# Patient Record
Sex: Female | Born: 1990 | Race: Black or African American | Hispanic: No | Marital: Single | Smoking: Never smoker
Health system: Southern US, Community
[De-identification: ages and names within clinical notes are randomized; demographics above are authoritative.]

## PROBLEM LIST (undated history)

## (undated) ENCOUNTER — Inpatient Hospital Stay (HOSPITAL_COMMUNITY): Payer: Self-pay

## (undated) DIAGNOSIS — F32A Depression, unspecified: Secondary | ICD-10-CM

## (undated) DIAGNOSIS — F329 Major depressive disorder, single episode, unspecified: Secondary | ICD-10-CM

## (undated) HISTORY — PX: NO PAST SURGERIES: SHX2092

---

## 2015-02-15 ENCOUNTER — Inpatient Hospital Stay (HOSPITAL_COMMUNITY)
Admission: AD | Admit: 2015-02-15 | Discharge: 2015-02-15 | Disposition: A | Discharge status: Home / Self Care | Payer: Medicaid Other | Source: Ambulatory Visit | Attending: Obstetrics and Gynecology | Admitting: Obstetrics and Gynecology

## 2015-02-15 ENCOUNTER — Encounter (HOSPITAL_COMMUNITY): Payer: Self-pay | Admitting: *Deleted

## 2015-02-15 DIAGNOSIS — Z3201 Encounter for pregnancy test, result positive: Secondary | ICD-10-CM | POA: Insufficient documentation

## 2015-02-15 DIAGNOSIS — Z3200 Encounter for pregnancy test, result unknown: Secondary | ICD-10-CM | POA: Diagnosis present

## 2015-02-15 DIAGNOSIS — O09891 Supervision of other high risk pregnancies, first trimester: Secondary | ICD-10-CM

## 2015-02-15 DIAGNOSIS — Z3490 Encounter for supervision of normal pregnancy, unspecified, unspecified trimester: Secondary | ICD-10-CM

## 2015-02-15 DIAGNOSIS — Z862 Personal history of diseases of the blood and blood-forming organs and certain disorders involving the immune mechanism: Secondary | ICD-10-CM

## 2015-02-15 LAB — POCT PREGNANCY, URINE: Preg Test, Ur: POSITIVE — AB

## 2015-02-15 MED ORDER — FERROUS SULFATE 325 (65 FE) MG PO TABS: 325.0000 mg | ORAL_TABLET | Freq: Two times a day (BID) | ORAL | Status: DC

## 2015-02-15 MED ORDER — PRENATAL PLUS 27-1 MG PO TABS: 1.0000 | ORAL_TABLET | Freq: Every day | ORAL | Status: DC

## 2015-02-15 NOTE — MAU Provider Note (Signed)
CC: Possible Pregnancy       Subjective HPI Caitlin James 23 y.o. G1P0 at [redacted]w[redacted]d by sure LMP 12/03/14 presents for verification of pregnancy. HPT positive. Denies vaginal bleeding, abdominal pain and nausea/vomiting. No other concerns.   Significant PMH: None.  Significant OB history: None Nonsmoker  Objective Blood pressure 121/86, pulse 78, temperature 97.6 F (36.4 C), temperature source Oral, resp. rate 18, height  (1.676 m), weight 69.4 kg (153 lb), last menstrual period 12/03/2014, unknown if currently breastfeeding.  Physical Exam General: WN, WD female in no apparent distress Abdomen: soft, nontender, without organomegaly. No DT FHR heard per RN  Lab Results Results for orders placed or performed during the hospital encounter of 02/15/15 (from the past 24 hour(s))  Pregnancy, urine POC     Status: Abnormal   Collection Time: 02/15/15  1:47 PM  Result Value Ref Range   Preg Test, Ur POSITIVE (A) NEGATIVE    Assessment 23 y.o. G1P0 at [redacted]w[redacted]d 1. Pregnancy   2. H/O iron deficiency anemia   3. Short interval between pregnancies affecting pregnancy in first trimester, antepartum     Plan AVS on pregnancy precautions Pregnancy verification letter and eligibility information given List of prenatal care providers given   Medication List    TAKE these medications        ferrous sulfate 325 (65 FE) MG tablet  Commonly known as:  FERROUSUL  Take 1 tablet (325 mg total) by mouth 2 (two) times daily.     prenatal vitamin w/FE, FA 27-1 MG Tabs tablet  Take 1 tablet by mouth daily.         Danae Orleans, CNM 02/15/2015 1:53 PM

## 2015-02-15 NOTE — MAU Note (Signed)
Needs proof of preg, that is about it.  +HPT June

## 2015-02-15 NOTE — MAU Note (Signed)
Urine in lab 

## 2015-02-15 NOTE — Discharge Instructions (Signed)
First Trimester of Pregnancy °The first trimester of pregnancy is from week 1 until the end of week 12 (months 1 through 3). A week after a sperm fertilizes an egg, the egg will implant on the wall of the uterus. This embryo will begin to develop into a baby. Genes from you and your partner are forming the baby. The female genes determine whether the baby is a boy or a girl. At 6-8 weeks, the eyes and face are formed, and the heartbeat can be seen on ultrasound. At the end of 12 weeks, all the baby's organs are formed.  °Now that you are pregnant, you will want to do everything you can to have a healthy baby. Two of the most important things are to get good prenatal care and to follow your health care provider's instructions. Prenatal care is all the medical care you receive before the baby's birth. This care will help prevent, find, and treat any problems during the pregnancy and childbirth. °BODY CHANGES °Your body goes through many changes during pregnancy. The changes vary from woman to woman.  °· You may gain or lose a couple of pounds at first. °· You may feel sick to your stomach (nauseous) and throw up (vomit). If the vomiting is uncontrollable, call your health care provider. °· You may tire easily. °· You may develop headaches that can be relieved by medicines approved by your health care provider. °· You may urinate more often. Painful urination may mean you have a bladder infection. °· You may develop heartburn as a result of your pregnancy. °· You may develop constipation because certain hormones are causing the muscles that push waste through your intestines to slow down. °· You may develop hemorrhoids or swollen, bulging veins (varicose veins). °· Your breasts may begin to grow larger and become tender. Your nipples may stick out more, and the tissue that surrounds them (areola) may become darker. °· Your gums may bleed and may be sensitive to brushing and flossing. °· Dark spots or blotches (chloasma,  mask of pregnancy) may develop on your face. This will likely fade after the baby is born. °· Your menstrual periods will stop. °· You may have a loss of appetite. °· You may develop cravings for certain kinds of food. °· You may have changes in your emotions from day to day, such as being excited to be pregnant or being concerned that something may go wrong with the pregnancy and baby. °· You may have more vivid and strange dreams. °· You may have changes in your hair. These can include thickening of your hair, rapid growth, and changes in texture. Some women also have hair loss during or after pregnancy, or hair that feels dry or thin. Your hair will most likely return to normal after your baby is born. °WHAT TO EXPECT AT YOUR PRENATAL VISITS °During a routine prenatal visit: °· You will be weighed to make sure you and the baby are growing normally. °· Your blood pressure will be taken. °· Your abdomen will be measured to track your baby's growth. °· The fetal heartbeat will be listened to starting around week 10 or 12 of your pregnancy. °· Test results from any previous visits will be discussed. °Your health care provider may ask you: °· How you are feeling. °· If you are feeling the baby move. °· If you have had any abnormal symptoms, such as leaking fluid, bleeding, severe headaches, or abdominal cramping. °· If you have any questions. °Other tests   that may be performed during your first trimester include: °· Blood tests to find your blood type and to check for the presence of any previous infections. They will also be used to check for low iron levels (anemia) and Rh antibodies. Later in the pregnancy, blood tests for diabetes will be done along with other tests if problems develop. °· Urine tests to check for infections, diabetes, or protein in the urine. °· An ultrasound to confirm the proper growth and development of the baby. °· An amniocentesis to check for possible genetic problems. °· Fetal screens for  spina bifida and Down syndrome. °· You may need other tests to make sure you and the baby are doing well. °HOME CARE INSTRUCTIONS  °Medicines °· Follow your health care provider's instructions regarding medicine use. Specific medicines may be either safe or unsafe to take during pregnancy. °· Take your prenatal vitamins as directed. °· If you develop constipation, try taking a stool softener if your health care provider approves. °Diet °· Eat regular, well-balanced meals. Choose a variety of foods, such as meat or vegetable-based protein, fish, milk and low-fat dairy products, vegetables, fruits, and whole grain breads and cereals. Your health care provider will help you determine the amount of weight gain that is right for you. °· Avoid raw meat and uncooked cheese. These carry germs that can cause birth defects in the baby. °· Eating four or five small meals rather than three large meals a day may help relieve nausea and vomiting. If you start to feel nauseous, eating a few soda crackers can be helpful. Drinking liquids between meals instead of during meals also seems to help nausea and vomiting. °· If you develop constipation, eat more high-fiber foods, such as fresh vegetables or fruit and whole grains. Drink enough fluids to keep your urine clear or pale yellow. °Activity and Exercise °· Exercise only as directed by your health care provider. Exercising will help you: °¨ Control your weight. °¨ Stay in shape. °¨ Be prepared for labor and delivery. °· Experiencing pain or cramping in the lower abdomen or low back is a good sign that you should stop exercising. Check with your health care provider before continuing normal exercises. °· Try to avoid standing for long periods of time. Move your legs often if you must stand in one place for a long time. °· Avoid heavy lifting. °· Wear low-heeled shoes, and practice good posture. °· You may continue to have sex unless your health care provider directs you  otherwise. °Relief of Pain or Discomfort °· Wear a good support bra for breast tenderness.   °· Take warm sitz baths to soothe any pain or discomfort caused by hemorrhoids. Use hemorrhoid cream if your health care provider approves.   °· Rest with your legs elevated if you have leg cramps or low back pain. °· If you develop varicose veins in your legs, wear support hose. Elevate your feet for 15 minutes, 3-4 times a day. Limit salt in your diet. °Prenatal Care °· Schedule your prenatal visits by the twelfth week of pregnancy. They are usually scheduled monthly at first, then more often in the last 2 months before delivery. °· Write down your questions. Take them to your prenatal visits. °· Keep all your prenatal visits as directed by your health care provider. °Safety °· Wear your seat belt at all times when driving. °· Make a list of emergency phone numbers, including numbers for family, friends, the hospital, and police and fire departments. °General Tips °·   Ask your health care provider for a referral to a local prenatal education class. Begin classes no later than at the beginning of month 6 of your pregnancy. °· Ask for help if you have counseling or nutritional needs during pregnancy. Your health care provider can offer advice or refer you to specialists for help with various needs. °· Do not use hot tubs, steam rooms, or saunas. °· Do not douche or use tampons or scented sanitary pads. °· Do not cross your legs for long periods of time. °· Avoid cat litter boxes and soil used by cats. These carry germs that can cause birth defects in the baby and possibly loss of the fetus by miscarriage or stillbirth. °· Avoid all smoking, herbs, alcohol, and medicines not prescribed by your health care provider. Chemicals in these affect the formation and growth of the baby. °· Schedule a dentist appointment. At home, brush your teeth with a soft toothbrush and be gentle when you floss. °SEEK MEDICAL CARE IF:  °· You have  dizziness. °· You have mild pelvic cramps, pelvic pressure, or nagging pain in the abdominal area. °· You have persistent nausea, vomiting, or diarrhea. °· You have a bad smelling vaginal discharge. °· You have pain with urination. °· You notice increased swelling in your face, hands, legs, or ankles. °SEEK IMMEDIATE MEDICAL CARE IF:  °· You have a fever. °· You are leaking fluid from your vagina. °· You have spotting or bleeding from your vagina. °· You have severe abdominal cramping or pain. °· You have rapid weight gain or loss. °· You vomit blood or material that looks like coffee grounds. °· You are exposed to German measles and have never had them. °· You are exposed to fifth disease or chickenpox. °· You develop a severe headache. °· You have shortness of breath. °· You have any kind of trauma, such as from a fall or a car accident. °Document Released: 07/03/2001 Document Revised: 11/23/2013 Document Reviewed: 05/19/2013 °Exit[employer]® Patient Information ©2015 [employer]. This information is not intended to replace advice given to you by your health care provider. Make sure you discuss any questions you have with your health care provider. °Prenatal Care Providers °[facility]    [facility] ° Phone- [phone]     Phone: [phone] °         °[facility]                      [facility] ° @[place]     Phone: [phone] ° Phone: [phone] °        [facility] °[facility]     Phone: [phone] ° Phone: [phone]   °        [facility] °[facility] [place]                hone: [phone] ° Phone: [phone] °        [facility] °Dr. Bernard Marshall      Phone: [phone] ° Phone: [phone] °        [facility] °[facility]                Phone: [phone] ° Women’s [facility]  ° Phone:[phone]    [facility] °         Phone:  [phone] °[facility] °  Phone:   [phone] ° °

## 2015-03-14 LAB — OB RESULTS CONSOLE GC/CHLAMYDIA
Chlamydia: NEGATIVE
GC PROBE AMP, GENITAL: NEGATIVE

## 2015-03-14 LAB — OB RESULTS CONSOLE ABO/RH: RH Type: POSITIVE

## 2015-03-14 LAB — OB RESULTS CONSOLE RPR: RPR: NONREACTIVE

## 2015-03-14 LAB — OB RESULTS CONSOLE RUBELLA ANTIBODY, IGM: Rubella: IMMUNE

## 2015-03-14 LAB — OB RESULTS CONSOLE HIV ANTIBODY (ROUTINE TESTING): HIV: NONREACTIVE

## 2015-03-14 LAB — OB RESULTS CONSOLE ANTIBODY SCREEN: Antibody Screen: NEGATIVE

## 2015-03-21 ENCOUNTER — Other Ambulatory Visit (HOSPITAL_COMMUNITY): Payer: Self-pay | Admitting: Nurse Practitioner

## 2015-03-21 DIAGNOSIS — Z3689 Encounter for other specified antenatal screening: Secondary | ICD-10-CM

## 2015-03-21 DIAGNOSIS — Z3A19 19 weeks gestation of pregnancy: Secondary | ICD-10-CM

## 2015-03-22 ENCOUNTER — Telehealth: Payer: Self-pay | Admitting: *Deleted

## 2015-03-22 NOTE — Telephone Encounter (Addendum)
Received message left on nurse line today at 1330.  Boneta Lucks from [facility] [phone].  Requesting a refill for patient for prenatal vitamins.  Requests a call.  Would also like our fax number so they can fax requests in the future.  8/31  0815  Rx for prenatal vitamins e-prescribed.  Diane Day RNC

## 2015-03-23 MED ORDER — PRENATAL PLUS 27-1 MG PO TABS: 1.0000 | ORAL_TABLET | Freq: Every day | ORAL | Status: AC

## 2015-04-15 ENCOUNTER — Ambulatory Visit (HOSPITAL_COMMUNITY)
Admission: RE | Admit: 2015-04-15 | Discharge: 2015-04-15 | Disposition: A | Discharge status: Home / Self Care | Payer: Medicaid Other | Source: Ambulatory Visit | Attending: Nurse Practitioner | Admitting: Nurse Practitioner

## 2015-04-15 ENCOUNTER — Other Ambulatory Visit (HOSPITAL_COMMUNITY): Payer: Self-pay | Admitting: Nurse Practitioner

## 2015-04-15 DIAGNOSIS — Z3689 Encounter for other specified antenatal screening: Secondary | ICD-10-CM

## 2015-04-15 DIAGNOSIS — Z3A19 19 weeks gestation of pregnancy: Secondary | ICD-10-CM

## 2015-04-15 DIAGNOSIS — Z3687 Encounter for antenatal screening for uncertain dates: Secondary | ICD-10-CM

## 2015-04-15 DIAGNOSIS — Z36 Encounter for antenatal screening of mother: Secondary | ICD-10-CM | POA: Insufficient documentation

## 2015-04-15 IMAGING — US US MFM OB COMP +14 WKS
1 series · 14 of 28 positions shown · non-contrast
Comparison: none

[Series 1: us mfm ob comp +14 wks · 14 of 67 slices shown]
[im 3/67]
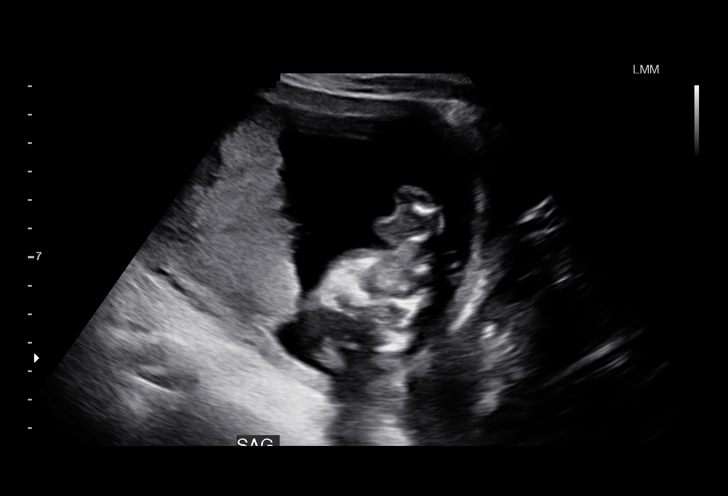
[im 8/67]
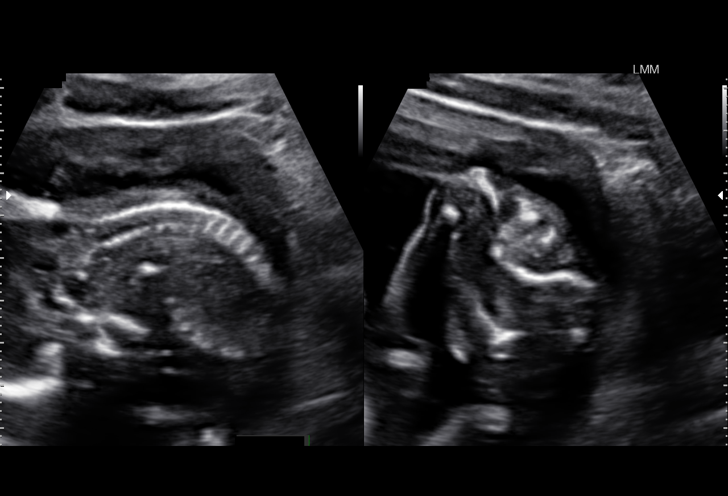
[im 13/67]
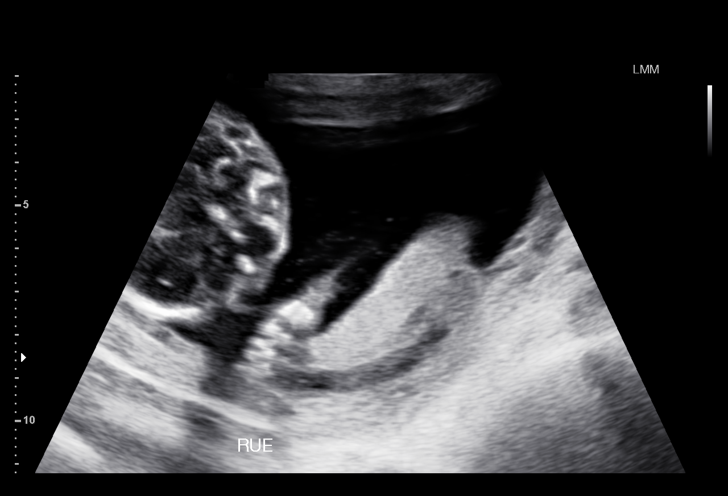
[im 18/67]
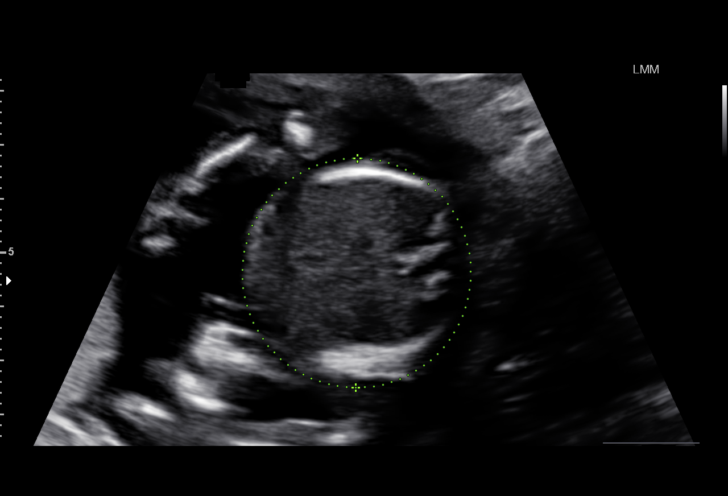
[im 23/67]
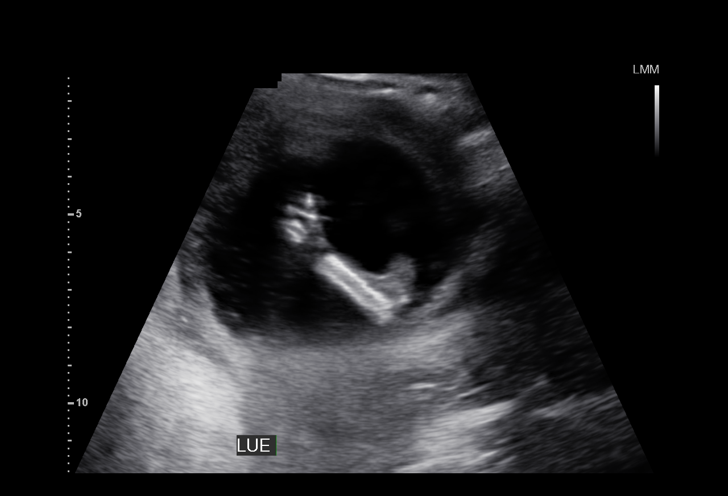
[im 27/67]
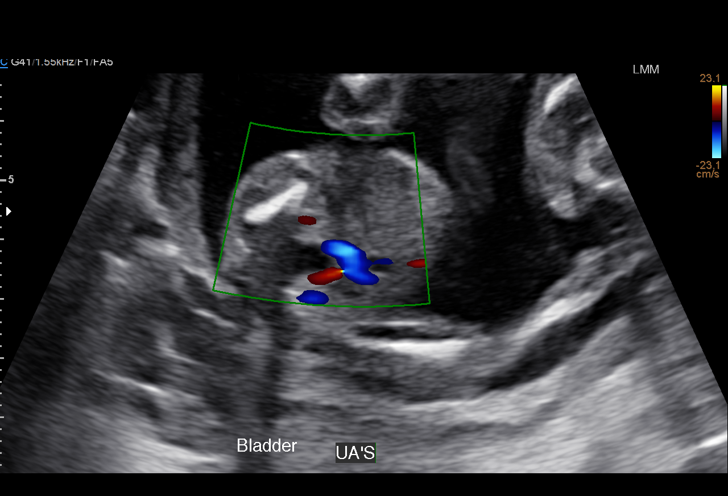
[im 32/67]
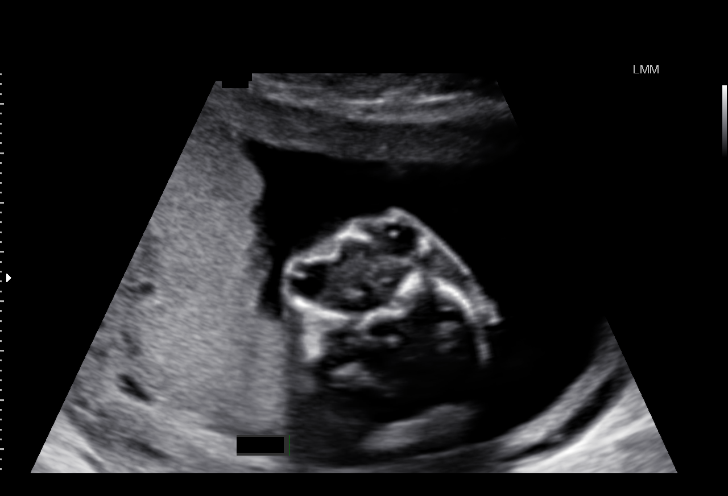
[im 37/67]
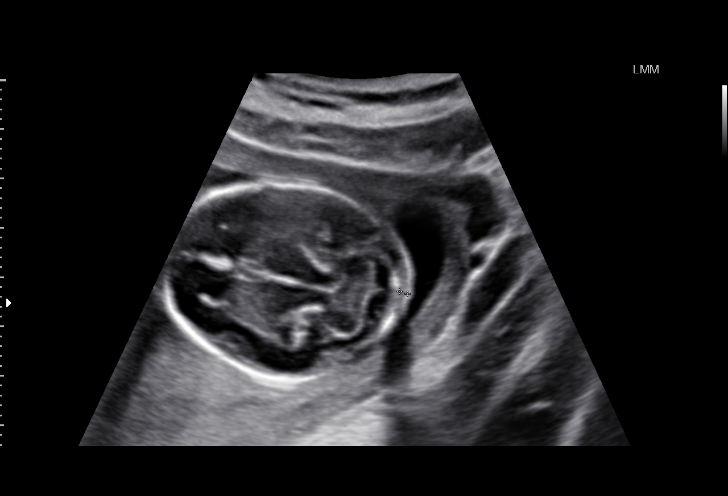
[im 42/67]
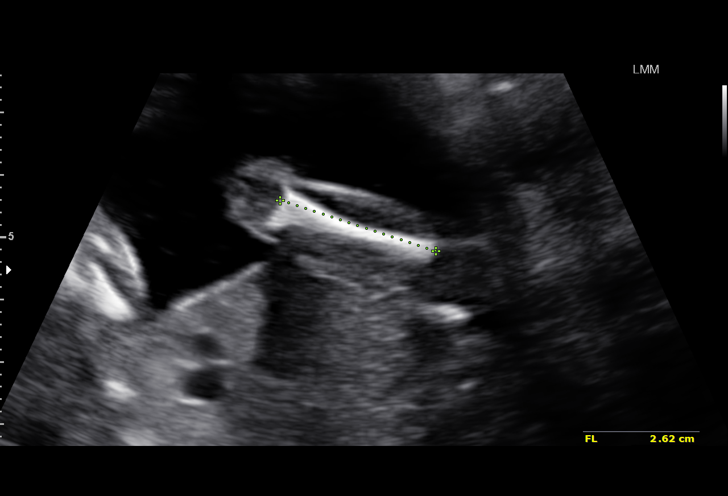
[im 47/67]
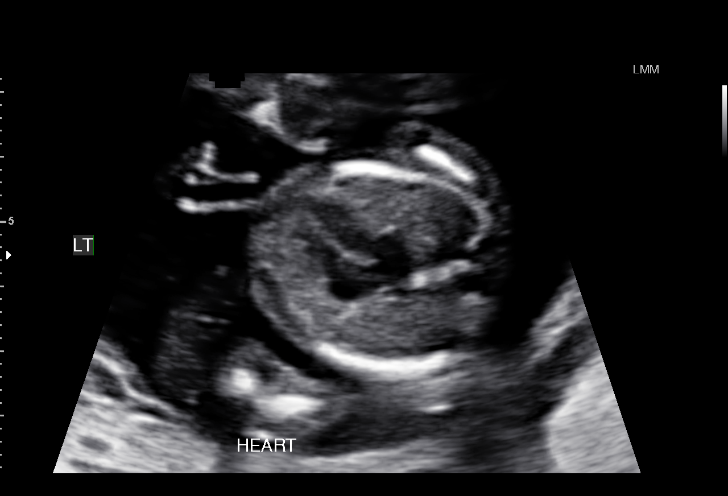
[im 52/67]
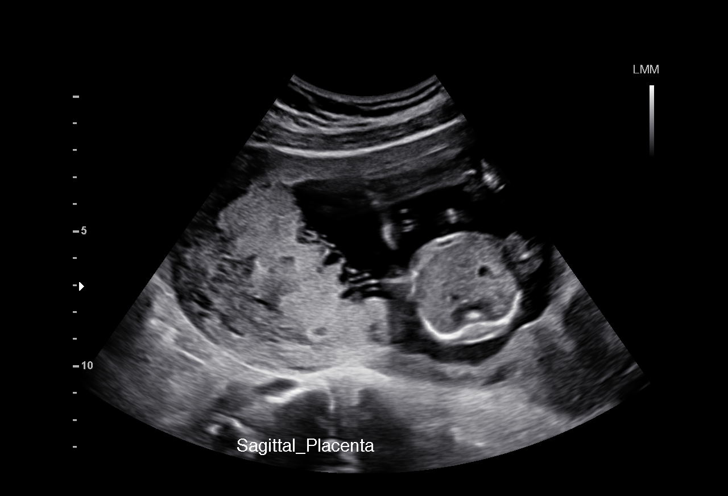
[im 57/67]
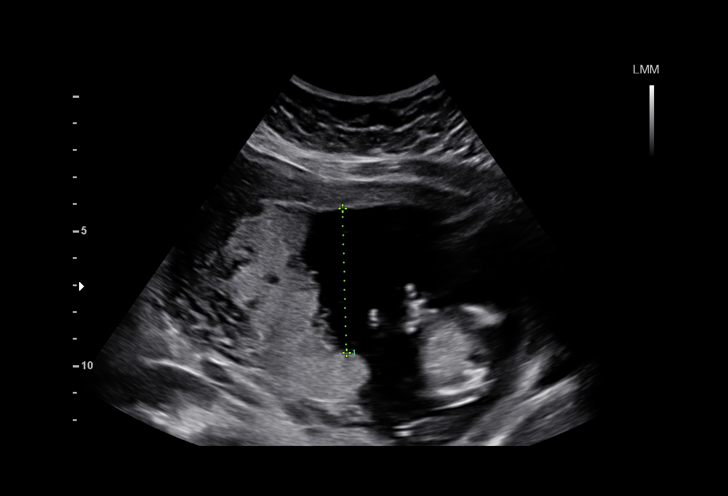
[im 62/67]
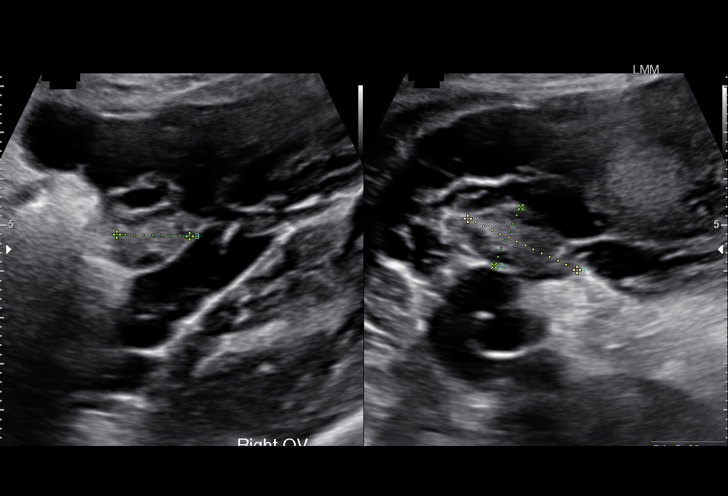
[im 67/67]
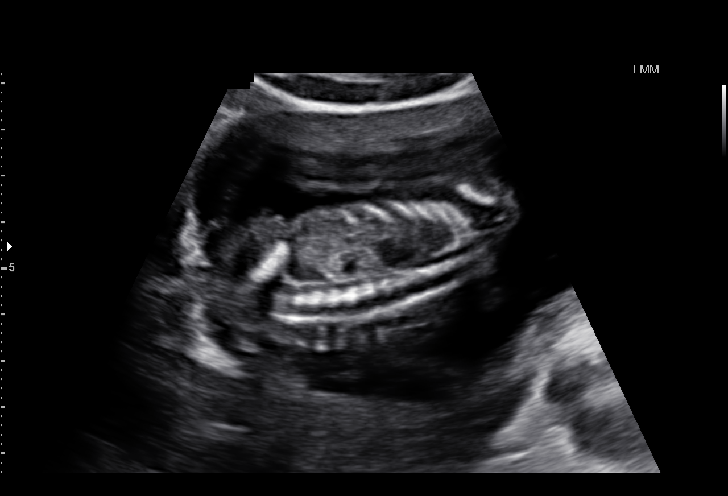

[14 of 28 positions shown; findings below may reference images not displayed]

OBSTETRICS REPORT
(Signed Final [DATE] [DATE])

Service(s) Provided

Indications

Basic anatomic survey                                 Z36
Uncertain LMP,  Establish Gestational Age             Z36
18 weeks gestation of pregnancy
Fetal Evaluation

Num Of Fetuses:    1
Fetal Heart Rate:  145                          bpm
Cardiac Activity:  Observed
Presentation:      Cephalic
Placenta:          Posterior, above cervical
os
P. Cord            Visualized, central
Insertion:

Amniotic Fluid
AFI FV:      Subjectively within normal limits
Larg Pckt:     5.4  cm
Biometry

BPD:     40.2  mm     G. Age:  18w 1d                CI:        73.57   70 - 86
FL/HC:      17.4   15.8 -
18
HC:     148.9  mm     G. Age:  18w 0d       35  %    HC/AC:      1.13   1.07 -
1.29
AC:     132.2  mm     G. Age:  18w 5d       67  %    FL/BPD:
FL:      25.9  mm     G. Age:  17w 6d       35  %    FL/AC:      19.6   20 - 24
HUM:     24.8  mm     G. Age:  17w 6d       43  %

Est. FW:     232  gm      0 lb 8 oz     51  %
Gestational Age

U/S Today:     18w 1d                                        EDD:   [DATE]
Best:          18w 1d     Det. By:  U/S ([DATE])           EDD:   [DATE]
Anatomy

Cranium:          Appears normal         Aortic Arch:      Not well visualized
Fetal Cavum:      Appears normal         Ductal Arch:      Not well visualized
Ventricles:       Appears normal         Diaphragm:        Appears normal
Choroid Plexus:   Appears normal         Stomach:          Appears normal, left
sided
Cerebellum:       Appears normal         Abdomen:          Appears normal
Posterior Fossa:  Appears normal         Abdominal Wall:   Appears nml (cord
insert, abd wall)
Nuchal Fold:      Appears normal         Cord Vessels:     Appears normal (3
vessel cord)
Face:             Orbits appear          Kidneys:          Appear normal
normal
Lips:             Appears normal         Bladder:          Appears normal
Palate:           Not well visualized    Spine:            Not well visualized
Heart:            Appears normal         Lower             Appears normal
(4CH, axis, and        Extremities:
situs)
RVOT:             Not well visualized    Upper             Appears normal
Extremities:
LVOT:             Appears normal

Other:  Heels and 5th digit appear normal. Fetus appears to be a male.
Technically difficult due to fetal position.
Cervix Uterus Adnexa

Cervical Length:    3.1      cm

Cervix:       Normal appearance by transabdominal scan.
Uterus:       No abnormality visualized.
Cul De Sac:   No free fluid seen.

Left Ovary:    Within normal limits.
Right Ovary:   Within normal limits.
Adnexa:     No abnormality visualized.
Impression

SIUP at [E8] (remote read only)
EFW 51st%
no dysmorphic features, noting limitations as above
no previa
Recommendations

Recommend completion of fetal survey by ultrasound in 6-8
weeks (please call to schedule).

## 2015-04-20 ENCOUNTER — Other Ambulatory Visit: Payer: Self-pay | Admitting: Obstetrics and Gynecology

## 2015-05-20 ENCOUNTER — Emergency Department (HOSPITAL_COMMUNITY)
Admission: EM | Admit: 2015-05-20 | Discharge: 2015-05-20 | Disposition: A | Discharge status: Home / Self Care | Payer: Medicaid Other | Attending: Emergency Medicine | Admitting: Emergency Medicine

## 2015-05-20 ENCOUNTER — Encounter (HOSPITAL_COMMUNITY): Payer: Self-pay | Admitting: Emergency Medicine

## 2015-05-20 DIAGNOSIS — Z79899 Other long term (current) drug therapy: Secondary | ICD-10-CM | POA: Diagnosis not present

## 2015-05-20 DIAGNOSIS — J029 Acute pharyngitis, unspecified: Secondary | ICD-10-CM | POA: Diagnosis not present

## 2015-05-20 DIAGNOSIS — O98812 Other maternal infectious and parasitic diseases complicating pregnancy, second trimester: Secondary | ICD-10-CM | POA: Diagnosis not present

## 2015-05-20 DIAGNOSIS — R61 Generalized hyperhidrosis: Secondary | ICD-10-CM | POA: Diagnosis not present

## 2015-05-20 DIAGNOSIS — Z3A23 23 weeks gestation of pregnancy: Secondary | ICD-10-CM | POA: Insufficient documentation

## 2015-05-20 DIAGNOSIS — B349 Viral infection, unspecified: Secondary | ICD-10-CM | POA: Diagnosis not present

## 2015-05-20 DIAGNOSIS — O9989 Other specified diseases and conditions complicating pregnancy, childbirth and the puerperium: Secondary | ICD-10-CM | POA: Diagnosis not present

## 2015-05-20 DIAGNOSIS — R0981 Nasal congestion: Secondary | ICD-10-CM

## 2015-05-20 DIAGNOSIS — O99512 Diseases of the respiratory system complicating pregnancy, second trimester: Secondary | ICD-10-CM | POA: Diagnosis present

## 2015-05-20 LAB — RAPID STREP SCREEN (MED CTR MEBANE ONLY): Streptococcus, Group A Screen (Direct): NEGATIVE

## 2015-05-20 MED ORDER — ACETAMINOPHEN ER 650 MG PO TBCR: 650.0000 mg | EXTENDED_RELEASE_TABLET | Freq: Three times a day (TID) | ORAL | Status: DC | PRN

## 2015-05-20 MED ORDER — CETIRIZINE HCL 10 MG PO TABS: 10.0000 mg | ORAL_TABLET | Freq: Every day | ORAL | Status: DC

## 2015-05-20 NOTE — Discharge Instructions (Signed)
Take tylenol as needed for sore throat. Take zyrtec as needed for congestion. Refer to attached documents for more information.

## 2015-05-20 NOTE — ED Notes (Signed)
Patient states itchy throat that started last night.  Patient states some nasal congestion.   Patient is pregnant, but denies any problems with pregnancy.

## 2015-05-20 NOTE — ED Provider Notes (Signed)
CSN: [id number]     Arrival date & time 05/20/15  [id number] History  By signing my name below, I, Elon SpannerGarrett Cook, attest that this documentation has been prepared under the direction and in the presence of [facility] HospitalKaitlyn Adoria Kawamoto, PA-C. Electronically Signed: Elon SpannerGarrett Cook ED Scribe. 05/20/2015. 10:42 AM.    Chief Complaint  Patient presents with  . Sore Throat   The history is provided by the patient. No language interpreter was used.    HPI Comments: Caitlin James is a 24 y.o. 5023 week pregnant female with no significant hx who presents to the Emergency Department complaining of an unchanged, itching sore throat onset last night; no treatments tried.  Associated symptoms include night sweats, congestion, and dry cough.  She reports her 376 year old son was recently sick with similar symptoms.  She denies hx of environmental allergies.  She denies fever, sinus pressure.    History reviewed. No pertinent past medical history. History reviewed. No pertinent past surgical history. No family history on file. Social History  Substance Use Topics  . Smoking status: Never Smoker   . Smokeless tobacco: None  . Alcohol Use: No   OB History    Gravida Para Term Preterm AB TAB SAB Ectopic Multiple Living   2              Review of Systems  Constitutional: Positive for diaphoresis.  HENT: Positive for congestion and sore throat.   Respiratory: Positive for cough.   All other systems reviewed and are negative.     Allergies  Review of patient's allergies indicates no known allergies.  Home Medications   Prior to Admission medications   Medication Sig Start Date End Date Taking? Authorizing Provider  acetaminophen (TYLENOL 8 HOUR) 650 MG CR tablet Take 1 tablet (650 mg total) by mouth every 8 (eight) hours as needed for pain. 05/20/15   Bralynn Donado, PA-C  cetirizine (ZYRTEC ALLERGY) 10 MG tablet Take 1 tablet (10 mg total) by mouth daily. 05/20/15   Kimberlynn Lumbra, PA-C  ferrous sulfate  (FERROUSUL) 325 (65 FE) MG tablet Take 1 tablet (325 mg total) by mouth 2 (two) times daily. 02/15/15   Deirdre Colin Mulders Poe, CNM  ferrous sulfate 324 (65 FE) MG TBEC TAKE ONE TABLET BY MOUTH TWO TIMES DAILY. (ROUND RED TABLET) 04/25/15   Deirdre Colin Mulders Poe, CNM  prenatal vitamin w/FE, FA (PRENATAL 1 + 1) 27-1 MG TABS tablet Take 1 tablet by mouth daily. 03/23/15   Deirdre C Poe, CNM   BP 119/76 mmHg  Pulse 95  Temp(Src) 98.3 F (36.8 C) (Oral)  Resp 19  SpO2 100%  LMP 12/03/2014 (Exact Date) Physical Exam  Constitutional: She is oriented to person, place, and time. She appears well-developed and well-nourished. No distress.  HENT:  Head: Normocephalic and atraumatic.  Mouth/Throat: No oropharyngeal exudate.  Bilateral tonsillar edema and erythema without exudate.  Eyes: Conjunctivae and EOM are normal.  Neck: Neck supple. No tracheal deviation present.  Cardiovascular: Normal rate and regular rhythm.  Exam reveals no gallop and no friction rub.   No murmur heard. Pulmonary/Chest: Effort normal and breath sounds normal. No respiratory distress. She has no wheezes. She has no rales. She exhibits no tenderness.  Abdominal: Soft. There is no tenderness.  Musculoskeletal: Normal range of motion.  Neurological: She is alert and oriented to person, place, and time.  Speech is goal-oriented. Moves limbs without ataxia.   Skin: Skin is warm and dry.  Psychiatric: She has a normal  mood and affect. Her behavior is normal.  Nursing note and vitals reviewed.   ED Course  Procedures (including critical care time)  DIAGNOSTIC STUDIES: Oxygen Saturation is 100% on RA, normal by my interpretation.    COORDINATION OF CARE:    Labs Review Labs Reviewed  RAPID STREP SCREEN (NOT AT [facility])  CULTURE, GROUP A STREP    Imaging Review No results found. I have personally reviewed and evaluated these images and lab results as part of my medical decision-making.   EKG Interpretation None      MDM    Final diagnoses:  Viral illness  Nasal congestion    10:47 AM Rapid strep negative. Patient likely has a viral illness vs seasonal allergies. Patient will be treated with zyrtec and tylenol. Vitals stable and patient afebrile.   I personally performed the services described in this documentation, which was scribed in my presence. The recorded information has been reviewed and is accurate.    Emilia Beck, PA-C 05/20/15 1048  Gilda Crease, MD 05/20/15 1531

## 2015-05-23 LAB — CULTURE, GROUP A STREP: STREP A CULTURE: NEGATIVE

## 2015-06-21 ENCOUNTER — Inpatient Hospital Stay (HOSPITAL_COMMUNITY)
Admission: AD | Admit: 2015-06-21 | Discharge: 2015-06-21 | Disposition: A | Discharge status: Home / Self Care | Payer: Medicaid Other | Source: Ambulatory Visit | Attending: Obstetrics & Gynecology | Admitting: Obstetrics & Gynecology

## 2015-06-21 ENCOUNTER — Encounter (HOSPITAL_COMMUNITY): Payer: Self-pay | Admitting: *Deleted

## 2015-06-21 DIAGNOSIS — O26899 Other specified pregnancy related conditions, unspecified trimester: Secondary | ICD-10-CM

## 2015-06-21 DIAGNOSIS — O9989 Other specified diseases and conditions complicating pregnancy, childbirth and the puerperium: Secondary | ICD-10-CM | POA: Diagnosis not present

## 2015-06-21 DIAGNOSIS — R102 Pelvic and perineal pain unspecified side: Secondary | ICD-10-CM

## 2015-06-21 DIAGNOSIS — O26893 Other specified pregnancy related conditions, third trimester: Secondary | ICD-10-CM | POA: Diagnosis not present

## 2015-06-21 DIAGNOSIS — R109 Unspecified abdominal pain: Secondary | ICD-10-CM

## 2015-06-21 DIAGNOSIS — Z3A28 28 weeks gestation of pregnancy: Secondary | ICD-10-CM | POA: Diagnosis not present

## 2015-06-21 DIAGNOSIS — R1030 Lower abdominal pain, unspecified: Secondary | ICD-10-CM | POA: Insufficient documentation

## 2015-06-21 LAB — URINALYSIS, ROUTINE W REFLEX MICROSCOPIC
Bilirubin Urine: NEGATIVE
Glucose, UA: NEGATIVE mg/dL
HGB URINE DIPSTICK: NEGATIVE
Ketones, ur: NEGATIVE mg/dL
NITRITE: NEGATIVE
PROTEIN: NEGATIVE mg/dL
SPECIFIC GRAVITY, URINE: 1.015 (ref 1.005–1.030)
pH: 7.5 (ref 5.0–8.0)

## 2015-06-21 LAB — URINE MICROSCOPIC-ADD ON: RBC / HPF: NONE SEEN RBC/hpf (ref 0–5)

## 2015-06-21 MED ORDER — CYCLOBENZAPRINE HCL 5 MG PO TABS: 5.0000 mg | ORAL_TABLET | Freq: Three times a day (TID) | ORAL | Status: DC | PRN

## 2015-06-21 NOTE — MAU Note (Signed)
Pt complain of sharp pain in pelvis and vagina.  It comes and goes.  It gets worse when she is sitting for long periods of time.  Pt is feeling the baby move.

## 2015-06-21 NOTE — MAU Provider Note (Signed)
History     CSN: [id number]  Arrival date and time: 06/21/15 1048   First Provider Initiated Contact with Patient 06/21/15 1144      Chief Complaint  Patient presents with  . Pelvic Pain   HPI Patient is 24 y.o. G3P2 [redacted]w[redacted]d here with complaints of vaginal pain/pressure with lower abdominal pain. Located over pubic symphysis and over hips. Worse with sudden changes in movement, positional change. She has not tried any medications or home treatments.   +FM, denies LOF, VB, contractions, vaginal discharge.  OB History    Gravida Para Term Preterm AB TAB SAB Ectopic Multiple Living   Past Medical History  Diagnosis Date  . Medical history non-contributory     Past Surgical History  Procedure Laterality Date  . No past surgeries      History reviewed. No pertinent family history.  Social History  Substance Use Topics  . Smoking status: Never Smoker   . Smokeless tobacco: None  . Alcohol Use: No    Allergies: No Known Allergies  Prescriptions prior to admission  Medication Sig Dispense Refill Last Dose  . acetaminophen (TYLENOL) 325 MG tablet Take 325 mg by mouth every 6 (six) hours as needed for moderate pain.   06/20/2015 at Unknown time  . cetirizine (ZYRTEC ALLERGY) 10 MG tablet Take 1 tablet (10 mg total) by mouth daily. 30 tablet 1 06/20/2015 at Unknown time  . ferrous sulfate 324 (65 FE) MG TBEC TAKE ONE TABLET BY MOUTH TWO TIMES DAILY. (ROUND RED TABLET) 60 tablet 1 06/21/2015 at Unknown time  . prenatal vitamin w/FE, FA (PRENATAL 1 + 1) 27-1 MG TABS tablet Take 1 tablet by mouth daily. 30 each 12 06/21/2015 at Unknown time  . acetaminophen (TYLENOL 8 HOUR) 650 MG CR tablet Take 1 tablet (650 mg total) by mouth every 8 (eight) hours as needed for pain. (Patient not taking: Reported on 06/21/2015) 30 tablet 0     Review of Systems  Constitutional: Negative for fever and chills.  Eyes: Negative for blurred vision and double vision.   Respiratory: Negative for cough and shortness of breath.   Cardiovascular: Negative for chest pain and orthopnea.  Gastrointestinal: Negative for nausea and vomiting.  Genitourinary: Negative for dysuria, frequency and flank pain.  Musculoskeletal: Negative for myalgias.  Skin: Negative for rash.  Neurological: Negative for dizziness, tingling, weakness and headaches.  Endo/Heme/Allergies: Does not bruise/bleed easily.  Psychiatric/Behavioral: Negative for depression and suicidal ideas. The patient is not nervous/anxious.    Physical Exam   Blood pressure 120/65, pulse 93, temperature 98 F (36.7 C), temperature source Oral, resp. rate 16, last menstrual period 12/03/2014, unknown if currently breastfeeding.  Physical Exam  Nursing note and vitals reviewed. Constitutional: She is oriented to person, place, and time. She appears well-developed and well-nourished. No distress.  Pregnant female  HENT:  Head: Normocephalic and atraumatic.  Eyes: Conjunctivae are normal. No scleral icterus.  Neck: Normal range of motion. Neck supple.  Cardiovascular: Normal rate and intact distal pulses.   Respiratory: Effort normal. She exhibits no tenderness.  GI: Soft. There is no tenderness. There is no rebound and no guarding.  Gravid. Mild TTP over pubic symphysis  Genitourinary: Vagina normal.  Musculoskeletal: Normal range of motion. She exhibits no edema.  Neurological: She is alert and oriented to person, place, and time.  Skin: Skin is warm and dry. No rash noted.  Psychiatric: She  has a normal mood and affect.    MAU Course  Procedures  MDM UA-non infectious appearing [facility]  NST 150/mod/+accels/no decels Toco quiet  Assessment and Plan  Caitlin James is a 24 y.o. presenting with lower abdominal pain and vaginal pressure - Round ligament pain - Recommended tylenol - Rx for flexeril provided and sent to pharmacy - preterm labor precautions.    Isa RankinKimberly Niles [facility] 06/21/2015, 12:04 PM

## 2015-06-21 NOTE — Discharge Instructions (Signed)
You can use Tylenol and the prescribed medication flexeril for pain.  Warm baths can be helpful  Abdominal Pain During Pregnancy Abdominal pain is common in pregnancy. Most of the time, it does not cause harm. There are many causes of abdominal pain. Some causes are more serious than others. Some of the causes of abdominal pain in pregnancy are easily diagnosed. Occasionally, the diagnosis takes time to understand. Other times, the cause is not determined. Abdominal pain can be a sign that something is very wrong with the pregnancy, or the pain may have nothing to do with the pregnancy at all. For this reason, always tell your health care provider if you have any abdominal discomfort. HOME CARE INSTRUCTIONS  Monitor your abdominal pain for any changes. The following actions may help to alleviate any discomfort you are experiencing:  Do not have sexual intercourse or put anything in your vagina until your symptoms go away completely.  Get plenty of rest until your pain improves.  Drink clear fluids if you feel nauseous. Avoid solid food as long as you are uncomfortable or nauseous.  Only take over-the-counter or prescription medicine as directed by your health care provider.  Keep all follow-up appointments with your health care provider. SEEK IMMEDIATE MEDICAL CARE IF:  You are bleeding, leaking fluid, or passing tissue from the vagina.  You have increasing pain or cramping.  You have persistent vomiting.  You have painful or bloody urination.  You have a fever.  You notice a decrease in your baby's movements.  You have extreme weakness or feel faint.  You have shortness of breath, with or without abdominal pain.  You develop a severe headache with abdominal pain.  You have abnormal vaginal discharge with abdominal pain.  You have persistent diarrhea.  You have abdominal pain that continues even after rest, or gets worse. MAKE SURE YOU:   Understand these  instructions.  Will watch your condition.  Will get help right away if you are not doing well or get worse.   This information is not intended to replace advice given to you by your health care provider. Make sure you discuss any questions you have with your health care provider.   Document Released: 07/09/2005 Document Revised: 04/29/2013 Document Reviewed: 02/05/2013 Elsevier Interactive Patient Education [employer] Elsevier Inc[employer]

## 2015-07-05 ENCOUNTER — Other Ambulatory Visit: Payer: Self-pay | Admitting: Obstetrics and Gynecology

## 2015-07-24 NOTE — L&D Delivery Note (Signed)
Delivery Note Pt pushed well and at 2:46 PM a viable female was delivered via  (Presentation: ROP).  APGAR:9 ,9 ; weight: 8lb 6.2oz.  Cord clamped and cut by friend of pt. Hospital cord blood sample collected. Placenta status: spont, intact.  Cord: loose nuchal x 1, reduced easily prior to delivery; 3 vessels  Anesthesia:  epidural Episiotomy:  none Lacerations:  none Est. Blood Loss (mL): 100   Mom to postpartum.  Baby to Couplet care / Skin to Skin.  Cam Hai CNM 09/16/2015, 3:00 PM

## 2015-08-04 ENCOUNTER — Encounter (HOSPITAL_COMMUNITY): Payer: Self-pay | Admitting: *Deleted

## 2015-08-04 ENCOUNTER — Emergency Department (HOSPITAL_COMMUNITY)
Admission: EM | Admit: 2015-08-04 | Discharge: 2015-08-04 | Disposition: A | Discharge status: Home / Self Care | Payer: Medicaid Other | Attending: Emergency Medicine | Admitting: Emergency Medicine

## 2015-08-04 DIAGNOSIS — Z79899 Other long term (current) drug therapy: Secondary | ICD-10-CM | POA: Diagnosis not present

## 2015-08-04 DIAGNOSIS — Z3A34 34 weeks gestation of pregnancy: Secondary | ICD-10-CM | POA: Diagnosis not present

## 2015-08-04 DIAGNOSIS — O99513 Diseases of the respiratory system complicating pregnancy, third trimester: Secondary | ICD-10-CM | POA: Diagnosis not present

## 2015-08-04 DIAGNOSIS — J309 Allergic rhinitis, unspecified: Secondary | ICD-10-CM | POA: Diagnosis not present

## 2015-08-04 NOTE — Discharge Instructions (Signed)
Please read and follow all provided instructions.  Your diagnoses today include:  1. Allergic rhinitis, unspecified allergic rhinitis type    Tests performed today include:  Vital signs. See below for your results today.   Medications prescribed:   None   Home care instructions:  Follow any educational materials contained in this packet.  Follow-up instructions: Please follow-up with your OB/GYN in the next 48-72 hours for further evaluation of symptoms and treatment   Return instructions:   Please return to the Emergency Department if you do not get better, if you get worse, or new symptoms OR  - Fever (temperature greater than 101.28F)  - Bleeding that does not stop with holding pressure to the area    -Severe pain (please note that you may be more sore the day after your accident)  - Chest Pain  - Difficulty breathing  - Severe nausea or vomiting  - Inability to tolerate food and liquids  - Passing out  - Skin becoming red around your wounds  - Change in mental status (confusion or lethargy)  - New numbness or weakness     Please return if you have any other emergent concerns.  Additional Information:  Your vital signs today were: BP 132/77 mmHg   Pulse 101   Temp(Src) 98.3 F (36.8 C) (Oral)   Resp 20   Ht 5\' 5"  (1.651 m)   Wt 90.719 kg   BMI 33.28 kg/m2   SpO2 100%   LMP 12/03/2014 (Exact Date) If your blood pressure (BP) was elevated above 135/85 this visit, please have this repeated by your doctor within one month. ---------------

## 2015-08-04 NOTE — ED Provider Notes (Signed)
CSN: [id number]     Arrival date & time 08/04/15  1006 History   First MD Initiated Contact with Patient 08/04/15 1046     Chief Complaint  Patient presents with  . URI   (Consider location/radiation/quality/duration/timing/severity/associated sxs/prior Treatment) HPI 25 y.o. female G3P2 at [place]34weeks1day presents to the Emergency Department today complaining of an itchy throat and cough for the past 4 days. Productive sputum (green). No other associated symptoms: no n/v/d, no fever, no rhinorrhea, no ear pain/discharge, no shortness of breath, no abdominal pain. Able to tolerate PO. No OTC meds. She is receiving prenatal care and is being followed by OB/GYN. Appointment scheduled for next week.   Past Medical History  Diagnosis Date  . Medical history non-contributory    Past Surgical History  Procedure Laterality Date  . No past surgeries     History reviewed. No pertinent family history. Social History  Substance Use Topics  . Smoking status: Never Smoker   . Smokeless tobacco: None  . Alcohol Use: No   OB History    Gravida Para Term Preterm AB TAB SAB Ectopic Multiple Living   3 2        2      Review of Systems 10 Systems reviewed and all are negative for acute change except as noted in the HPI.  Allergies  Review of patient's allergies indicates no known allergies.  Home Medications   Prior to Admission medications   Medication Sig Start Date End Date Taking? Authorizing Provider  cetirizine (ZYRTEC ALLERGY) 10 MG tablet Take 1 tablet (10 mg total) by mouth daily. 05/20/15  Yes Kaitlyn Szekalski, PA-C  ferrous sulfate 324 (65 FE) MG TBEC TAKE ONE TABLET BY MOUTH TWO TIMES DAILY. (ROUND RED TABLET) 07/06/15  Yes Deirdre C Poe, CNM  prenatal vitamin w/FE, FA (PRENATAL 1 + 1) 27-1 MG TABS tablet Take 1 tablet by mouth daily. 03/23/15  Yes Deirdre C Poe, CNM  acetaminophen (TYLENOL 8 HOUR) 650 MG CR tablet Take 1 tablet (650 mg total) by mouth every 8 (eight) hours as needed  for pain. Patient not taking: Reported on 06/21/2015 05/20/15   Emilia BeckKaitlyn Szekalski, PA-C  cyclobenzaprine (FLEXERIL) 5 MG tablet Take 1 tablet (5 mg total) by mouth 3 (three) times daily as needed for muscle spasms. Patient not taking: Reported on 08/04/2015 06/21/15   Federico FlakeKimberly Niles [place], MD   BP 132/77 mmHg  Pulse 101  Temp(Src) 98.3 F (36.8 C) (Oral)  Resp 20  Ht 5\' 5"  (1.651 m)  Wt 90.719 kg  BMI 33.28 kg/m2  SpO2 100%  LMP 12/03/2014 (Exact Date)   Physical Exam  Constitutional: She is oriented to person, place, and time. She appears well-developed and well-nourished.  HENT:  Head: Normocephalic and atraumatic.  Right Ear: Tympanic membrane, external ear and ear canal normal.  Left Ear: Tympanic membrane, external ear and ear canal normal.  Nose: Nose normal.  Mouth/Throat: Uvula is midline, oropharynx is clear and moist and mucous membranes are normal.  Eyes: EOM are normal.  Cardiovascular: Normal rate and regular rhythm.   Pulmonary/Chest: Effort normal.  Abdominal: Soft.  Musculoskeletal: Normal range of motion.  Lymphadenopathy:    She has no cervical adenopathy.  Neurological: She is alert and oriented to person, place, and time.  Skin: Skin is warm and dry.  Psychiatric: She has a normal mood and affect. Her behavior is normal. Thought content normal.  Nursing note and vitals reviewed.   ED Course  Procedures (including critical care time)  Labs Review Labs Reviewed - No data to display  Imaging Review No results found. I have personally reviewed and evaluated these images and lab results as part of my medical decision-making.   EKG Interpretation None      MDM  I have reviewed the relevant previous healthcare records. I obtained HPI from historian.  ED Course: Doppler  Assessment: 24yF G3P2 at 34weeks1day presents with an itchy throat and cough. No other associated symptoms. No pelvic pain/vaginal bleeding. Afebrile. On exam, TMs clear, posterior  pharynx unremarkable. Lungs CTA. Fetal movement felt on abdominal exam. Doppler heart sounds documented below.  I gave patient a pamphlet on medications that are safe in pregnancy that can treat allergic rhinitis. Will have her follow up with her OB/GYN on scheduled appointment for further evaluation of symptoms.     Fetal Heart tones present and at 150bpm Patient is in no acute distress. Vital Signs are stable. Patient is able to ambulate. Patient able to tolerate PO.   Disposition/Plan:  DC Home with follow up to OB/GYN Additional Verbal discharge instructions given and discussed with patient.  Pt Instructed to f/u with OB/GYN in the next 48 hours for evaluation and treatment of symptoms. Return precautions given Pt acknowledges and agrees with plan  Supervising Physician Melene Plan, DO    Final diagnoses:  Allergic rhinitis, unspecified allergic rhinitis type       Audry Pili, PA-C 08/04/15 1139  Melene Plan, DO 08/04/15 1346

## 2015-08-04 NOTE — ED Notes (Signed)
Pt reports a itchy throat, generally feeling bad for several days.

## 2015-08-08 ENCOUNTER — Other Ambulatory Visit: Payer: Self-pay | Admitting: Obstetrics and Gynecology

## 2015-08-22 ENCOUNTER — Inpatient Hospital Stay (HOSPITAL_COMMUNITY)
Admission: AD | Admit: 2015-08-22 | Discharge: 2015-08-22 | Disposition: A | Discharge status: Home / Self Care | Payer: Medicaid Other | Source: Ambulatory Visit | Attending: Family Medicine | Admitting: Family Medicine

## 2015-08-22 ENCOUNTER — Encounter (HOSPITAL_COMMUNITY): Payer: Self-pay

## 2015-08-22 DIAGNOSIS — M549 Dorsalgia, unspecified: Secondary | ICD-10-CM | POA: Insufficient documentation

## 2015-08-22 DIAGNOSIS — R109 Unspecified abdominal pain: Secondary | ICD-10-CM | POA: Diagnosis present

## 2015-08-22 DIAGNOSIS — O9989 Other specified diseases and conditions complicating pregnancy, childbirth and the puerperium: Secondary | ICD-10-CM

## 2015-08-22 DIAGNOSIS — O26893 Other specified pregnancy related conditions, third trimester: Secondary | ICD-10-CM | POA: Insufficient documentation

## 2015-08-22 DIAGNOSIS — O26899 Other specified pregnancy related conditions, unspecified trimester: Secondary | ICD-10-CM

## 2015-08-22 DIAGNOSIS — Z3A37 37 weeks gestation of pregnancy: Secondary | ICD-10-CM | POA: Insufficient documentation

## 2015-08-22 MED ORDER — CYCLOBENZAPRINE HCL 5 MG PO TABS: 5.0000 mg | ORAL_TABLET | Freq: Three times a day (TID) | ORAL | Status: DC | PRN

## 2015-08-22 MED ORDER — ACETAMINOPHEN 325 MG PO TABS
650.0000 mg | ORAL_TABLET | Freq: Once | ORAL | Status: AC
  Administered 2015-08-22: 650 mg via ORAL
  Filled 2015-08-22: qty 2

## 2015-08-22 NOTE — MAU Provider Note (Signed)
History     CSN: [id number] Arrival date and time: 08/22/15 1018 First Provider Initiated Contact with Patient 08/22/15 1114      Chief Complaint  Patient presents with  . Abdominal Pain   HPI  Patient is 25 y.o. G3P2 [redacted]w[redacted]d here with complaints of back pain and contractions. Reports back pain located mid back, no radiation. No associated numbness/tingling. Reports ctx starting today, not increasing in intensity and not more than 6 per hour. .  +FM, denies LOF, VB, vaginal discharge.   OB History    Gravida Para Term Preterm AB TAB SAB Ectopic Multiple Living   Past Medical History  Diagnosis Date  . Medical history non-contributory     Past Surgical History  Procedure Laterality Date  . No past surgeries      History reviewed. No pertinent family history.  Social History  Substance Use Topics  . Smoking status: Never Smoker   . Smokeless tobacco: None  . Alcohol Use: No    Allergies: No Known Allergies  Prescriptions prior to admission  Medication Sig Dispense Refill Last Dose  . cetirizine (ZYRTEC ALLERGY) 10 MG tablet Take 1 tablet (10 mg total) by mouth daily. 30 tablet 1 08/22/2015 at Unknown time  . ferrous sulfate 324 (65 Fe) MG TBEC TAKE ONE TABLET BY MOUTH TWO TIMES DAILY. (ROUND RED TABLET) 60 tablet 1 08/22/2015 at Unknown time  . prenatal vitamin w/FE, FA (PRENATAL 1 + 1) 27-1 MG TABS tablet Take 1 tablet by mouth daily. 30 each 12 08/22/2015 at Unknown time  . acetaminophen (TYLENOL 8 HOUR) 650 MG CR tablet Take 1 tablet (650 mg total) by mouth every 8 (eight) hours as needed for pain. 30 tablet 0 prn  . cyclobenzaprine (FLEXERIL) 5 MG tablet Take 1 tablet (5 mg total) by mouth 3 (three) times daily as needed for muscle spasms. (Patient not taking: Reported on 08/04/2015) 30 tablet 0     Review of Systems  Constitutional: Negative for fever and chills.  Eyes: Negative for blurred vision and double vision.  Respiratory: Negative for cough  and shortness of breath.   Cardiovascular: Negative for chest pain and orthopnea.  Gastrointestinal: Negative for nausea and vomiting.  Genitourinary: Negative for dysuria, frequency and flank pain.  Musculoskeletal: Negative for myalgias.  Skin: Negative for rash.  Neurological: Negative for dizziness, tingling, weakness and headaches.  Endo/Heme/Allergies: Does not bruise/bleed easily.  Psychiatric/Behavioral: Negative for depression and suicidal ideas. The patient is not nervous/anxious.    Physical Exam   Blood pressure 114/66, pulse 87, temperature 97.9 F (36.6 C), temperature source Oral, resp. rate 18, weight 208 lb 12.8 oz (94.711 kg), last menstrual period 12/03/2014, unknown if currently breastfeeding.  Physical Exam  Nursing note and vitals reviewed. Constitutional: She is oriented to person, place, and time. She appears well-developed and well-nourished. No distress.  Pregnant female  HENT:  Head: Normocephalic and atraumatic.  Eyes: Conjunctivae are normal. No scleral icterus.  Neck: Normal range of motion. Neck supple.  Cardiovascular: Normal rate and intact distal pulses.   Respiratory: Effort normal. She exhibits no tenderness.  GI: Soft. There is no tenderness. There is no rebound and no guarding.  Gravid  Genitourinary: Vagina normal.  Musculoskeletal: Normal range of motion. She exhibits no edema.  Neurological: She is alert and oriented to person, place, and time.  Skin: Skin is warm and dry. No rash noted.  Psychiatric:  She has a normal mood and affect.    Dilation: 2 Effacement (%): 50 Cervical Position: Posterior Station: -3 Presentation: Vertex Exam by:: Briasia Flinders MD  MAU Course  Procedures  MDM Tylenol given to patient in MAU, unable to give flexeril given she is driving today.  NST- Reactive  Assessment and Plan  LEANE LORING is a 25 y.o. G3P2 at [redacted]w[redacted]d presenting with cramping likely early labor.  #Early labor -discharge home with labor  precautions  #Back pain - instructed to take tylenol prn - Rx for flexeril sent to pharmacy on file  Federico Flake 08/22/2015, 11:14 AM

## 2015-08-22 NOTE — Discharge Instructions (Signed)
You were seen for cramping. Your cervix is about 2 cm dilated. You are likely in early labor. This can last for hours or days. You can take flexeril as need for back pain/cramping. This medication can make you sleepy so do not plan to drive after taking this medication. The medication (flexeril) was sent to your pharmacy. Additionally you can take tylenol for any back pain.   Come to the MAU (maternity admission unit) for 1) Strong contractions every 2-3 minutes for at least 1 hour that do no go away when you drink water or take a warm shower. These contractions will be so strong all you can do is breath through them 2) Vaginal bleeding- anything more than spotting 3) Loss of fluid like you broke your water 4) Decreased movement of your baby  Caitlin James Contractions Contractions of the uterus can occur throughout pregnancy. Contractions are not always a sign that you are in labor.  WHAT ARE BRAXTON HICKS CONTRACTIONS?  Contractions that occur before labor are called Braxton Hicks contractions, or false labor. Toward the end of pregnancy (32-34 weeks), these contractions can develop more often and may become more forceful. This is not true labor because these contractions do not result in opening (dilatation) and thinning of the cervix. They are sometimes difficult to tell apart from true labor because these contractions can be forceful and people have different pain tolerances. You should not feel embarrassed if you go to the hospital with false labor. Sometimes, the only way to tell if you are in true labor is for your health care provider to look for changes in the cervix. If there are no prenatal problems or other health problems associated with the pregnancy, it is completely safe to be sent home with false labor and await the onset of true labor. HOW CAN YOU TELL THE DIFFERENCE BETWEEN TRUE AND FALSE LABOR? False Labor  The contractions of false labor are usually shorter and not as hard as  those of true labor.   The contractions are usually irregular.   The contractions are often felt in the front of the lower abdomen and in the groin.   The contractions may go away when you walk around or change positions while lying down.   The contractions get weaker and are shorter lasting as time goes on.   The contractions do not usually become progressively stronger, regular, and closer together as with true labor.  True Labor  Contractions in true labor last 30-70 seconds, become very regular, usually become more intense, and increase in frequency.   The contractions do not go away with walking.   The discomfort is usually felt in the top of the uterus and spreads to the lower abdomen and low back.   True labor can be determined by your health care provider with an exam. This will show that the cervix is dilating and getting thinner.  WHAT TO REMEMBER  Keep up with your usual exercises and follow other instructions given by your health care provider.   Take medicines as directed by your health care provider.   Keep your regular prenatal appointments.   Eat and drink lightly if you think you are going into labor.   If Braxton Hicks contractions are making you uncomfortable:   Change your position from lying down or resting to walking, or from walking to resting.   Sit and rest in a tub of warm water.   Drink 2-3 glasses of water. Dehydration may cause these contractions.  Do slow and deep breathing several times an hour.  WHEN SHOULD I SEEK IMMEDIATE MEDICAL CARE? Seek immediate medical care if:  Your contractions become stronger, more regular, and closer together.   You have fluid leaking or gushing from your vagina.   You have a fever.   You pass blood-tinged mucus.   You have vaginal bleeding.   You have continuous abdominal pain.   You have low back pain that you never had before.   You feel your baby's head pushing down and  causing pelvic pressure.   Your baby is not moving as much as it used to.    This information is not intended to replace advice given to you by your health care provider. Make sure you discuss any questions you have with your health care provider.   Document Released: 07/09/2005 Document Revised: 07/14/2013 Document Reviewed: 04/20/2013 Elsevier Interactive Patient Education [employer]

## 2015-08-22 NOTE — MAU Note (Signed)
Severe pain in lower back,  If bends over- can't get back up.  Hurts to walk. Hurts to rollover. Having period like cramps, comes and goes.

## 2015-08-22 NOTE — MAU Note (Signed)
Urine in lab 

## 2015-08-28 ENCOUNTER — Inpatient Hospital Stay (HOSPITAL_COMMUNITY)
Admission: AD | Admit: 2015-08-28 | Discharge: 2015-08-28 | Disposition: A | Discharge status: Home / Self Care | Payer: Medicaid Other | Source: Ambulatory Visit | Attending: Obstetrics & Gynecology | Admitting: Obstetrics & Gynecology

## 2015-08-28 ENCOUNTER — Encounter (HOSPITAL_COMMUNITY): Payer: Self-pay | Admitting: *Deleted

## 2015-08-28 DIAGNOSIS — O98313 Other infections with a predominantly sexual mode of transmission complicating pregnancy, third trimester: Secondary | ICD-10-CM | POA: Diagnosis not present

## 2015-08-28 DIAGNOSIS — O133 Gestational [pregnancy-induced] hypertension without significant proteinuria, third trimester: Secondary | ICD-10-CM | POA: Diagnosis not present

## 2015-08-28 DIAGNOSIS — Z3A38 38 weeks gestation of pregnancy: Secondary | ICD-10-CM | POA: Diagnosis not present

## 2015-08-28 DIAGNOSIS — O163 Unspecified maternal hypertension, third trimester: Secondary | ICD-10-CM

## 2015-08-28 DIAGNOSIS — O479 False labor, unspecified: Secondary | ICD-10-CM

## 2015-08-28 DIAGNOSIS — R109 Unspecified abdominal pain: Secondary | ICD-10-CM | POA: Diagnosis present

## 2015-08-28 DIAGNOSIS — O471 False labor at or after 37 completed weeks of gestation: Secondary | ICD-10-CM | POA: Diagnosis not present

## 2015-08-28 DIAGNOSIS — A599 Trichomoniasis, unspecified: Secondary | ICD-10-CM

## 2015-08-28 DIAGNOSIS — A5901 Trichomonal vulvovaginitis: Secondary | ICD-10-CM | POA: Insufficient documentation

## 2015-08-28 HISTORY — DX: Major depressive disorder, single episode, unspecified: F32.9

## 2015-08-28 HISTORY — DX: Depression, unspecified: F32.A

## 2015-08-28 LAB — COMPREHENSIVE METABOLIC PANEL
ALBUMIN: 3.3 g/dL — AB (ref 3.5–5.0)
ALK PHOS: 73 U/L (ref 38–126)
ALT: 14 U/L (ref 14–54)
AST: 18 U/L (ref 15–41)
Anion gap: 9 (ref 5–15)
BUN: 6 mg/dL (ref 6–20)
CALCIUM: 9.6 mg/dL (ref 8.9–10.3)
CO2: 24 mmol/L (ref 22–32)
CREATININE: 0.56 mg/dL (ref 0.44–1.00)
Chloride: 104 mmol/L (ref 101–111)
GFR calc Af Amer: >60 mL/min (ref >60)
GFR calc non Af Amer: >60 mL/min (ref >60)
GLUCOSE: 87 mg/dL (ref 65–99)
Potassium: 4.2 mmol/L (ref 3.5–5.1)
SODIUM: 137 mmol/L (ref 135–145)
Total Bilirubin: 0.1 mg/dL — ABNORMAL LOW (ref 0.3–1.2)
Total Protein: 7.4 g/dL (ref 6.5–8.1)

## 2015-08-28 LAB — PROTEIN / CREATININE RATIO, URINE
CREATININE, URINE: 123 mg/dL
Creatinine, Urine: 192 mg/dL
Protein Creatinine Ratio: 0.33 mg/mg{Cre} — ABNORMAL HIGH (ref 0.00–0.15)
Protein Creatinine Ratio: 0.18 mg/mg{Cre} — ABNORMAL HIGH (ref 0.00–0.15)
Total Protein, Urine: 63 mg/dL
Total Protein, Urine: 22 mg/dL

## 2015-08-28 LAB — URINALYSIS, ROUTINE W REFLEX MICROSCOPIC
Bilirubin Urine: NEGATIVE
Glucose, UA: NEGATIVE mg/dL
Ketones, ur: 15 mg/dL — AB
Nitrite: NEGATIVE
PH: 6 (ref 5.0–8.0)
Protein, ur: 30 mg/dL — AB
SPECIFIC GRAVITY, URINE: 1.025 (ref 1.005–1.030)

## 2015-08-28 LAB — CBC
HCT: 32 % — ABNORMAL LOW (ref 36.0–46.0)
Hemoglobin: 9.8 g/dL — ABNORMAL LOW (ref 12.0–15.0)
MCH: 22.7 pg — AB (ref 26.0–34.0)
MCHC: 30.6 g/dL (ref 30.0–36.0)
MCV: 74.2 fL — ABNORMAL LOW (ref 78.0–100.0)
PLATELETS: 208 10*3/uL (ref 150–400)
RBC: 4.31 MIL/uL (ref 3.87–5.11)
RDW: 13.8 % (ref 11.5–15.5)
WBC: 9.2 10*3/uL (ref 4.0–10.5)

## 2015-08-28 LAB — URINE MICROSCOPIC-ADD ON

## 2015-08-28 LAB — HEPATITIS B SURFACE ANTIGEN: HEP B S AG: NEGATIVE

## 2015-08-28 MED ORDER — METRONIDAZOLE 500 MG PO TABS
2000.0000 mg | ORAL_TABLET | Freq: Once | ORAL | Status: AC
  Administered 2015-08-28: 2000 mg via ORAL
  Filled 2015-08-28: qty 4

## 2015-08-28 NOTE — Progress Notes (Signed)
Provider notified of pt arrival to MAU.  Notified of cervical exam results.  Notified of urine results.  States he will put in additional orders and come see the pt.

## 2015-08-28 NOTE — MAU Note (Signed)
Patient presents with lower abdominal cramping had last night and again today on and off, denies vaginal bleeding, denies LOF>

## 2015-08-28 NOTE — Progress Notes (Signed)
Provider notified of pt BP readings.  States to not discharge pt.  States to cycle blood pressures and he will put in lab orders.

## 2015-08-28 NOTE — MAU Provider Note (Signed)
MAU HISTORY AND PHYSICAL  Chief Complaint:  Abdominal Cramping   Caitlin James is a 25 y.o.  G3P2 with IUP at [redacted]w[redacted]d presenting for Abdominal Cramping  Having mild period-like cramps since this morning. No leakage of fluid or vaginal bleeding but does endorse more vaginal discharge than normal. Positive fetal movements. No headache, vision change, ruq pain, or sob. Denies hx htn this pregnancy or prior.  Past Medical History  Diagnosis Date  . Depression     Past Surgical History  Procedure Laterality Date  . No past surgeries      History reviewed. No pertinent family history.  Social History  Substance Use Topics  . Smoking status: Never Smoker   . Smokeless tobacco: None  . Alcohol Use: No    No Known Allergies  Prescriptions prior to admission  Medication Sig Dispense Refill Last Dose  . acetaminophen (TYLENOL 8 HOUR) 650 MG CR tablet Take 1 tablet (650 mg total) by mouth every 8 (eight) hours as needed for pain. 30 tablet 0 two weeks  . cetirizine (ZYRTEC ALLERGY) 10 MG tablet Take 1 tablet (10 mg total) by mouth daily. 30 tablet 1 08/28/2015 at Unknown time  . cyclobenzaprine (FLEXERIL) 5 MG tablet Take 1 tablet (5 mg total) by mouth 3 (three) times daily as needed for muscle spasms. 30 tablet 1 08/27/2015 at Unknown time  . ferrous sulfate 324 (65 Fe) MG TBEC TAKE ONE TABLET BY MOUTH TWO TIMES DAILY. (ROUND RED TABLET) 60 tablet 1 08/28/2015 at Unknown time  . prenatal vitamin w/FE, FA (PRENATAL 1 + 1) 27-1 MG TABS tablet Take 1 tablet by mouth daily. 30 each 12 08/28/2015 at Unknown time    Review of Systems - Negative except for what is mentioned in HPI.  Physical Exam  Blood pressure 137/79, pulse 92, temperature 98.2 F (36.8 C), resp. rate 16, height  (1.651 m), weight 209 lb (94.802 kg), last menstrual period 12/03/2014, unknown if currently breastfeeding. GENERAL: Well-developed, well-nourished female in no acute distress.  LUNGS: Clear to auscultation  bilaterally.  HEART: Regular rate and rhythm. ABDOMEN: Soft, nontender, nondistended, gravid.  EXTREMITIES: Nontender, no edema, 2+ distal pulses. Cervical Exam: 2/50/-3 (RN exam) Presentation: cephalic FHT:  145/mod/+a/-d Contractions: Every 5-10 mins   Labs: Results for orders placed or performed during the hospital encounter of 08/28/15 (from the past 24 hour(s))  Urinalysis, Routine w reflex microscopic (not at [facility])   Collection Time: 08/28/15  3:40 PM  Result Value Ref Range   Color, Urine YELLOW YELLOW   APPearance CLEAR CLEAR   Specific Gravity, Urine 1.025 1.005 - 1.030   pH 6.0 5.0 - 8.0   Glucose, UA NEGATIVE NEGATIVE mg/dL   Hgb urine dipstick TRACE (A) NEGATIVE   Bilirubin Urine NEGATIVE NEGATIVE   Ketones, ur 15 (A) NEGATIVE mg/dL   Protein, ur 30 (A) NEGATIVE mg/dL   Nitrite NEGATIVE NEGATIVE   Leukocytes, UA MODERATE (A) NEGATIVE  Urine microscopic-add on   Collection Time: 08/28/15  3:40 PM  Result Value Ref Range   Squamous Epithelial / LPF 6-30 (A) NONE SEEN   WBC, UA 6-30 0 - 5 WBC/hpf   RBC / HPF 0-5 0 - 5 RBC/hpf   Bacteria, UA FEW (A) NONE SEEN   Urine-Other MUCOUS PRESENT   Protein / creatinine ratio, urine   Collection Time: 08/28/15  3:40 PM  Result Value Ref Range   Creatinine, Urine 192.00 mg/dL   Total Protein, Urine 63 mg/dL   Protein  Creatinine Ratio 0.33 (H) 0.00 - 0.15 mg/mg[Cre]  Comprehensive metabolic panel   Collection Time: 08/28/15  4:31 PM  Result Value Ref Range   Sodium 137 135 - 145 mmol/L   Potassium 4.2 3.5 - 5.1 mmol/L   Chloride 104 101 - 111 mmol/L   CO2 24 22 - 32 mmol/L   Glucose, Bld 87 65 - 99 mg/dL   BUN 6 6 - 20 mg/dL   Creatinine, Ser 1.61 0.44 - 1.00 mg/dL   Calcium 9.6 8.9 - 09.6 mg/dL   Total Protein 7.4 6.5 - 8.1 g/dL   Albumin 3.3 (L) 3.5 - 5.0 g/dL   AST 18 15 - 41 U/L   ALT 14 14 - 54 U/L   Alkaline Phosphatase 73 38 - 126 U/L   Total Bilirubin 0.1 (L) 0.3 - 1.2 mg/dL   GFR calc non Af Amer >60 >60  mL/min   GFR calc Af Amer >60 >60 mL/min   Anion gap 9 5 - 15  CBC   Collection Time: 08/28/15  4:31 PM  Result Value Ref Range   WBC 9.2 4.0 - 10.5 K/uL   RBC 4.31 3.87 - 5.11 MIL/uL   Hemoglobin 9.8 (L) 12.0 - 15.0 g/dL   HCT 04.5 (L) 40.9 - 81.1 %   MCV 74.2 (L) 78.0 - 100.0 fL   MCH 22.7 (L) 26.0 - 34.0 pg   MCHC 30.6 30.0 - 36.0 g/dL   RDW 91.4 78.2 - 95.6 %   Platelets 208 150 - 400 K/uL  Protein / creatinine ratio, urine   Collection Time: 08/28/15  6:00 PM  Result Value Ref Range   Creatinine, Urine 123.00 mg/dL   Total Protein, Urine 22 mg/dL   Protein Creatinine Ratio 0.18 (H) 0.00 - 0.15 mg/mg[Cre]    Imaging Studies:  No results found.  Assessment/Plan: Caitlin James is  25 y.o. G3P2 at [redacted]w[redacted]d presents with Abdominal Cramping  # Braxton hicks contractions: mild, cervix 2 cm in clinic a couple of weeks ago and 2 cm here today. Reactive NST. No symptoms pprom or abruption. - home with labor and pprom return precautions  # Trichomonis vaginalis - discovered incidentally on urinalysis - flagyl 2 g po once - f/u gonorrhea, chlamydia, hiv, and rpr - condoms, partner testing/treatment recs  # Elevate BP: mildly elevated, solitary elevation, monitored for 3 hours, remainder values here wnl. Asymptomatic, preE labs wnl (initial upc 0.33 but likely 2/2 increased vaginal discharge from trich as above, as repeat UPC with straight cath specimen 0.18). BPs at [facility] reviewed, all have been wnl. - home with strict preeclampsia/eclampsia return precautions and recommending BP check at [facility] this week   Silvano Bilis 2/5/20176:55 PM

## 2015-08-28 NOTE — Discharge Instructions (Signed)
Trichomonas Test The trichomonas test is done to diagnose trichomoniasis, an infection caused by an organism called Trichomonas. Trichomoniasis is a sexually transmitted infection (STI). In women, it causes vaginal infections. In men, it can cause the tube that carries urine (urethra) to become inflamed (urethritis). You may have this test as a part of a routine screening for STIs or if you have symptoms of trichomoniasis. To perform the test, your health care provider will take a sample of discharge. The sample is taken from the vagina or cervix in women and from the urethra in men. A urine sample can also be used for testing. RESULTS It is your responsibility to obtain your test results. Ask the lab or department performing the test when and how you will get your results. Contact your health care provider to discuss any questions you have about your results.  Meaning of Negative Test Results A negative test means you do not have trichomoniasis. Follow your health care provider's directions about any follow-up testing.  Meaning of Positive Test Results A positive test result means you have an active infection that needs to be treated with antibiotic medicine. All your current sexual partners must also be treated or it is likely you will get reinfected.  If your test is positive, your health care provider will start you on medicine and may advise you to:  Not have sexual intercourse until your infection has cleared up.  Use a latex condom properly every time you have sexual intercourse.  Limit the number of sexual partners you have. The more partners you have, the greater your risk of contracting trichomoniasis or another STI.  Tell all sexual partners about your infection so they can also be treated and to prevent reinfection.   This information is not intended to replace advice given to you by your health care provider. Make sure you discuss any questions you have with your health care  provider.   Document Released: 08/11/2004 Document Revised: 07/30/2014 Document Reviewed: 07/21/2013 Elsevier Interactive Patient Education [employer] Third Trimester of Pregnancy The third trimester is from week 29 through week 42, months 7 through 9. The third trimester is a time when the fetus is growing rapidly. At the end of the ninth month, the fetus is about 20 inches in length and weighs 6-10 pounds.  BODY CHANGES Your body goes through many changes during pregnancy. The changes vary from woman to woman.   Your weight will continue to increase. You can expect to gain 25-35 pounds (11-16 kg) by the end of the pregnancy.  You may begin to get stretch marks on your hips, abdomen, and breasts.  You may urinate more often because the fetus is moving lower into your pelvis and pressing on your bladder.  You may develop or continue to have heartburn as a result of your pregnancy.  You may develop constipation because certain hormones are causing the muscles that push waste through your intestines to slow down.  You may develop hemorrhoids or swollen, bulging veins (varicose veins).  You may have pelvic pain because of the weight gain and pregnancy hormones relaxing your joints between the bones in your pelvis. Backaches may result from overexertion of the muscles supporting your posture.  You may have changes in your hair. These can include thickening of your hair, rapid growth, and changes in texture. Some women also have hair loss during or after pregnancy, or hair that feels dry or thin. Your hair will most likely return to normal after  your baby is born.  Your breasts will continue to grow and be tender. A yellow discharge may leak from your breasts called colostrum.  Your belly button may stick out.  You may feel short of breath because of your expanding uterus.  You may notice the fetus "dropping," or moving lower in your abdomen.  You may have a bloody mucus  discharge. This usually occurs a few days to a week before labor begins.  Your cervix becomes thin and soft (effaced) near your due date. WHAT TO EXPECT AT YOUR PRENATAL EXAMS  You will have prenatal exams every 2 weeks until week 36. Then, you will have weekly prenatal exams. During a routine prenatal visit:  You will be weighed to make sure you and the fetus are growing normally.  Your blood pressure is taken.  Your abdomen will be measured to track your baby's growth.  The fetal heartbeat will be listened to.  Any test results from the previous visit will be discussed.  You may have a cervical check near your due date to see if you have effaced. At around 36 weeks, your caregiver will check your cervix. At the same time, your caregiver will also perform a test on the secretions of the vaginal tissue. This test is to determine if a type of bacteria, Group B streptococcus, is present. Your caregiver will explain this further. Your caregiver may ask you:  What your birth plan is.  How you are feeling.  If you are feeling the baby move.  If you have had any abnormal symptoms, such as leaking fluid, bleeding, severe headaches, or abdominal cramping.  If you are using any tobacco products, including cigarettes, chewing tobacco, and electronic cigarettes.  If you have any questions. Other tests or screenings that may be performed during your third trimester include:  Blood tests that check for low iron levels (anemia).  Fetal testing to check the health, activity level, and growth of the fetus. Testing is done if you have certain medical conditions or if there are problems during the pregnancy.  HIV (human immunodeficiency virus) testing. If you are at high risk, you may be screened for HIV during your third trimester of pregnancy. FALSE LABOR You may feel small, irregular contractions that eventually go away. These are called Braxton Hicks contractions, or false labor.  Contractions may last for hours, days, or even weeks before true labor sets in. If contractions come at regular intervals, intensify, or become painful, it is best to be seen by your caregiver.  SIGNS OF LABOR   Menstrual-like cramps.  Contractions that are 5 minutes apart or less.  Contractions that start on the top of the uterus and spread down to the lower abdomen and back.  A sense of increased pelvic pressure or back pain.  A watery or bloody mucus discharge that comes from the vagina. If you have any of these signs before the 37th week of pregnancy, call your caregiver right away. You need to go to the hospital to get checked immediately. HOME CARE INSTRUCTIONS   Avoid all smoking, herbs, alcohol, and unprescribed drugs. These chemicals affect the formation and growth of the baby.  Do not use any tobacco products, including cigarettes, chewing tobacco, and electronic cigarettes. If you need help quitting, ask your health care provider. You may receive counseling support and other resources to help you quit.  Follow your caregiver's instructions regarding medicine use. There are medicines that are either safe or unsafe to take during pregnancy.  Exercise only as directed by your caregiver. Experiencing uterine cramps is a good sign to stop exercising.  Continue to eat regular, healthy meals.  Wear a good support bra for breast tenderness.  Do not use hot tubs, steam rooms, or saunas.  Wear your seat belt at all times when driving.  Avoid raw meat, uncooked cheese, cat litter boxes, and soil used by cats. These carry germs that can cause birth defects in the baby.  Take your prenatal vitamins.  Take 1500-2000 mg of calcium daily starting at the 20th week of pregnancy until you deliver your baby.  Try taking a stool softener (if your caregiver approves) if you develop constipation. Eat more high-fiber foods, such as fresh vegetables or fruit and whole grains. Drink plenty of  fluids to keep your urine clear or pale yellow.  Take warm sitz baths to soothe any pain or discomfort caused by hemorrhoids. Use hemorrhoid cream if your caregiver approves.  If you develop varicose veins, wear support hose. Elevate your feet for 15 minutes, 3-4 times a day. Limit salt in your diet.  Avoid heavy lifting, wear low heal shoes, and practice good posture.  Rest a lot with your legs elevated if you have leg cramps or low back pain.  Visit your dentist if you have not gone during your pregnancy. Use a soft toothbrush to brush your teeth and be gentle when you floss.  A sexual relationship may be continued unless your caregiver directs you otherwise.  Do not travel far distances unless it is absolutely necessary and only with the approval of your caregiver.  Take prenatal classes to understand, practice, and ask questions about the labor and delivery.  Make a trial run to the hospital.  Pack your hospital bag.  Prepare the baby's nursery.  Continue to go to all your prenatal visits as directed by your caregiver. SEEK MEDICAL CARE IF:  You are unsure if you are in labor or if your water has broken.  You have dizziness.  You have mild pelvic cramps, pelvic pressure, or nagging pain in your abdominal area.  You have persistent nausea, vomiting, or diarrhea.  You have a bad smelling vaginal discharge.  You have pain with urination. SEEK IMMEDIATE MEDICAL CARE IF:   You have a fever.  You are leaking fluid from your vagina.  You have spotting or bleeding from your vagina.  You have severe abdominal cramping or pain.  You have rapid weight loss or gain.  You have shortness of breath with chest pain.  You notice sudden or extreme swelling of your face, hands, ankles, feet, or legs.  You have not felt your baby move in over an hour.  You have severe headaches that do not go away with medicine.  You have vision changes.   This information is not intended  to replace advice given to you by your health care provider. Make sure you discuss any questions you have with your health care provider.   Document Released: 07/03/2001 Document Revised: 07/30/2014 Document Reviewed: 09/09/2012 Elsevier Interactive Patient Education 2016 [employer] Fetal Movement Counts Patient Name: __________________________________________________ Patient Due Date: ____________________ Performing a fetal movement count is highly recommended in high-risk pregnancies, but it is good for every pregnant woman to do. Your health care provider may ask you to start counting fetal movements at 28 weeks of the pregnancy. Fetal movements often increase:  After eating a full meal.  After physical activity.  After eating or drinking something sweet or cold.  At rest. Pay attention to when you feel the baby is most active. This will help you notice a pattern of your baby's sleep and wake cycles and what factors contribute to an increase in fetal movement. It is important to perform a fetal movement count at the same time each day when your baby is normally most active.  HOW TO COUNT FETAL MOVEMENTS  Find a quiet and comfortable area to sit or lie down on your left side. Lying on your left side provides the best blood and oxygen circulation to your baby.  Write down the day and time on a sheet of paper or in a journal.  Start counting kicks, flutters, swishes, rolls, or jabs in a 2-hour period. You should feel at least 10 movements within 2 hours.  If you do not feel 10 movements in 2 hours, wait 2-3 hours and count again. Look for a change in the pattern or not enough counts in 2 hours. SEEK MEDICAL CARE IF:  You feel less than 10 counts in 2 hours, tried twice.  There is no movement in over an hour.  The pattern is changing or taking longer each day to reach 10 counts in 2 hours.  You feel the baby is not moving as he or she usually does. Date: ____________ Movements:  ____________ Start time: ____________ Caitlin James time: ____________  Date: ____________ Movements: ____________ Start time: ____________ Caitlin James time: ____________ Date: ____________ Movements: ____________ Start time: ____________ Caitlin James time: ____________ Date: ____________ Movements: ____________ Start time: ____________ Caitlin James time: ____________ Date: ____________ Movements: ____________ Start time: ____________ Caitlin James time: ____________ Date: ____________ Movements: ____________ Start time: ____________ Caitlin James time: ____________ Date: ____________ Movements: ____________ Start time: ____________ Caitlin James time: ____________ Date: ____________ Movements: ____________ Start time: ____________ Caitlin James time: ____________  Date: ____________ Movements: ____________ Start time: ____________ Caitlin James time: ____________ Date: ____________ Movements: ____________ Start time: ____________ Caitlin James time: ____________ Date: ____________ Movements: ____________ Start time: ____________ Caitlin James time: ____________ Date: ____________ Movements: ____________ Start time: ____________ Caitlin James time: ____________ Date: ____________ Movements: ____________ Start time: ____________ Caitlin James time: ____________ Date: ____________ Movements: ____________ Start time: ____________ Caitlin James time: ____________ Date: ____________ Movements: ____________ Start time: ____________ Caitlin James time: ____________  Date: ____________ Movements: ____________ Start time: ____________ Caitlin James time: ____________ Date: ____________ Movements: ____________ Start time: ____________ Caitlin James time: ____________ Date: ____________ Movements: ____________ Start time: ____________ Caitlin James time: ____________ Date: ____________ Movements: ____________ Start time: ____________ Caitlin James time: ____________ Date: ____________ Movements: ____________ Start time: ____________ Caitlin James time: ____________ Date: ____________ Movements: ____________ Start time: ____________ Caitlin James  time: ____________ Date: ____________ Movements: ____________ Start time: ____________ Caitlin James time: ____________  Date: ____________ Movements: ____________ Start time: ____________ Caitlin James time: ____________ Date: ____________ Movements: ____________ Start time: ____________ Caitlin James time: ____________ Date: ____________ Movements: ____________ Start time: ____________ Caitlin James time: ____________ Date: ____________ Movements: ____________ Start time: ____________ Caitlin James time: ____________ Date: ____________ Movements: ____________ Start time: ____________ Caitlin James time: ____________ Date: ____________ Movements: ____________ Start time: ____________ Caitlin James time: ____________ Date: ____________ Movements: ____________ Start time: ____________ Caitlin James time: ____________  Date: ____________ Movements: ____________ Start time: ____________ Caitlin James time: ____________ Date: ____________ Movements: ____________ Start time: ____________ Caitlin James time: ____________ Date: ____________ Movements: ____________ Start time: ____________ Caitlin James time: ____________ Date: ____________ Movements: ____________ Start time: ____________ Caitlin James time: ____________ Date: ____________ Movements: ____________ Start time: ____________ Caitlin James time: ____________ Date: ____________ Movements: ____________ Start time: ____________ Caitlin James time: ____________ Date: ____________ Movements: ____________ Start time: ____________ Caitlin James time: ____________  Date: ____________ Movements: ____________ Start time: ____________ Caitlin James time: ____________  Date: ____________ Movements: ____________ Start time: ____________ Caitlin James time: ____________ Date: ____________ Movements: ____________ Start time: ____________ Caitlin James time: ____________ Date: ____________ Movements: ____________ Start time: ____________ Caitlin James time: ____________ Date: ____________ Movements: ____________ Start time: ____________ Caitlin James time: ____________ Date: ____________  Movements: ____________ Start time: ____________ Caitlin James time: ____________ Date: ____________ Movements: ____________ Start time: ____________ Caitlin James time: ____________  Date: ____________ Movements: ____________ Start time: ____________ Caitlin James time: ____________ Date: ____________ Movements: ____________ Start time: ____________ Caitlin James time: ____________ Date: ____________ Movements: ____________ Start time: ____________ Caitlin James time: ____________ Date: ____________ Movements: ____________ Start time: ____________ Caitlin James time: ____________ Date: ____________ Movements: ____________ Start time: ____________ Caitlin James time: ____________ Date: ____________ Movements: ____________ Start time: ____________ Caitlin James time: ____________ Date: ____________ Movements: ____________ Start time: ____________ Caitlin James time: ____________  Date: ____________ Movements: ____________ Start time: ____________ Caitlin James time: ____________ Date: ____________ Movements: ____________ Start time: ____________ Caitlin James time: ____________ Date: ____________ Movements: ____________ Start time: ____________ Caitlin James time: ____________ Date: ____________ Movements: ____________ Start time: ____________ Caitlin James time: ____________ Date: ____________ Movements: ____________ Start time: ____________ Caitlin James time: ____________ Date: ____________ Movements: ____________ Start time: ____________ Caitlin James time: ____________   This information is not intended to replace advice given to you by your health care provider. Make sure you discuss any questions you have with your health care provider.   Document Released: 08/08/2006 Document Revised: 07/30/2014 Document Reviewed: 05/05/2012 Elsevier Interactive Patient Education 2016 [employer] of the uterus can occur throughout pregnancy. Contractions are not always a sign that you are in labor.  WHAT ARE BRAXTON HICKS CONTRACTIONS?  Contractions that occur  before labor are called Braxton Hicks contractions, or false labor. Toward the end of pregnancy (32-34 weeks), these contractions can develop more often and may become more forceful. This is not true labor because these contractions do not result in opening (dilatation) and thinning of the cervix. They are sometimes difficult to tell apart from true labor because these contractions can be forceful and people have different pain tolerances. You should not feel embarrassed if you go to the hospital with false labor. Sometimes, the only way to tell if you are in true labor is for your health care provider to look for changes in the cervix. If there are no prenatal problems or other health problems associated with the pregnancy, it is completely safe to be sent home with false labor and await the onset of true labor. HOW CAN YOU TELL THE DIFFERENCE BETWEEN TRUE AND FALSE LABOR? False Labor  The contractions of false labor are usually shorter and not as hard as those of true labor.   The contractions are usually irregular.   The contractions are often felt in the front of the lower abdomen and in the groin.   The contractions may go away when you walk around or change positions while lying down.   The contractions get weaker and are shorter lasting as time goes on.   The contractions do not usually become progressively stronger, regular, and closer together as with true labor.  True Labor  Contractions in true labor last 30-70 seconds, become very regular, usually become more intense, and increase in frequency.   The contractions do not go away with walking.   The discomfort is usually felt in the top of the uterus and spreads to the lower abdomen and low back.   True labor can be determined by your health care provider with an exam. This will show that the cervix is dilating and getting thinner.  WHAT TO REMEMBER  Keep up with your usual exercises and follow other instructions given  by your health care provider.   Take medicines as directed by your health care provider.   Keep your regular prenatal appointments.   Eat and drink lightly if you think you are going into labor.   If Braxton Hicks contractions are making you uncomfortable:   Change your position from lying down or resting to walking, or from walking to resting.   Sit and rest in a tub of warm water.   Drink 2-3 glasses of water. Dehydration may cause these contractions.   Do slow and deep breathing several times an hour.  WHEN SHOULD I SEEK IMMEDIATE MEDICAL CARE? Seek immediate medical care if:  Your contractions become stronger, more regular, and closer together.   You have fluid leaking or gushing from your vagina.   You have a fever.   You pass blood-tinged mucus.   You have vaginal bleeding.   You have continuous abdominal pain.   You have low back pain that you never had before.   You feel your baby's head pushing down and causing pelvic pressure.   Your baby is not moving as much as it used to.    This information is not intended to replace advice given to you by your health care provider. Make sure you discuss any questions you have with your health care provider.   Document Released: 07/09/2005 Document Revised: 07/14/2013 Document Reviewed: 04/20/2013 Elsevier Interactive Patient Education [employer]

## 2015-08-28 NOTE — Progress Notes (Cosign Needed)
RN labor eval. I was called to assess urinalysis. It shows trichomonas. Will send for culture given hgb and leuks, but think this likely represents trichomoniasis. Giving flagyl 2 g po and checking gonorrhea, chlamydia, syphilis, hep b, and hiv. I discussed with patient. Recommending partner(s) treatment and other std testing. Always use condom. [facility] f/u. No sex until partner(s) tested/treated. Patient voiced understanding.

## 2015-08-29 LAB — HIV ANTIBODY (ROUTINE TESTING W REFLEX): HIV SCREEN 4TH GENERATION: NONREACTIVE

## 2015-08-29 LAB — CULTURE, OB URINE

## 2015-08-29 LAB — GC/CHLAMYDIA PROBE AMP (~~LOC~~) NOT AT ARMC
Chlamydia: NEGATIVE
Neisseria Gonorrhea: NEGATIVE

## 2015-08-29 LAB — RPR: RPR Ser Ql: NONREACTIVE

## 2015-08-29 LAB — OB RESULTS CONSOLE GBS: GBS: NEGATIVE

## 2015-09-06 ENCOUNTER — Encounter (HOSPITAL_COMMUNITY): Payer: Self-pay | Admitting: *Deleted

## 2015-09-06 ENCOUNTER — Inpatient Hospital Stay (HOSPITAL_COMMUNITY)
Admission: AD | Admit: 2015-09-06 | Discharge: 2015-09-06 | Disposition: A | Discharge status: Home / Self Care | Payer: Medicaid Other | Source: Ambulatory Visit | Attending: Obstetrics & Gynecology | Admitting: Obstetrics & Gynecology

## 2015-09-06 DIAGNOSIS — Z3493 Encounter for supervision of normal pregnancy, unspecified, third trimester: Secondary | ICD-10-CM | POA: Diagnosis present

## 2015-09-06 LAB — URINALYSIS, ROUTINE W REFLEX MICROSCOPIC
Bilirubin Urine: NEGATIVE
Glucose, UA: NEGATIVE mg/dL
HGB URINE DIPSTICK: NEGATIVE
Ketones, ur: NEGATIVE mg/dL
Nitrite: NEGATIVE
Protein, ur: NEGATIVE mg/dL
SPECIFIC GRAVITY, URINE: 1.02 (ref 1.005–1.030)
pH: 7 (ref 5.0–8.0)

## 2015-09-06 LAB — URINE MICROSCOPIC-ADD ON: RBC / HPF: NONE SEEN RBC/hpf (ref 0–5)

## 2015-09-06 NOTE — Discharge Instructions (Signed)

## 2015-09-06 NOTE — MAU Note (Signed)
Soreness in vagina, esp at night. Hurts down there when i get up and feel crackling down low. Mild cramping. Denies LOF or bleeding

## 2015-09-16 ENCOUNTER — Inpatient Hospital Stay (HOSPITAL_COMMUNITY): Payer: Medicaid Other | Admitting: Anesthesiology

## 2015-09-16 ENCOUNTER — Encounter (HOSPITAL_COMMUNITY): Payer: Self-pay | Admitting: *Deleted

## 2015-09-16 ENCOUNTER — Inpatient Hospital Stay (HOSPITAL_COMMUNITY)
Admission: AD | Admit: 2015-09-16 | Discharge: 2015-09-17 | DRG: 775 | Disposition: A | Discharge status: Home / Self Care | Payer: Medicaid Other | Source: Ambulatory Visit | Attending: Family Medicine | Admitting: Family Medicine

## 2015-09-16 DIAGNOSIS — O4292 Full-term premature rupture of membranes, unspecified as to length of time between rupture and onset of labor: Principal | ICD-10-CM | POA: Diagnosis present

## 2015-09-16 DIAGNOSIS — Z3A40 40 weeks gestation of pregnancy: Secondary | ICD-10-CM

## 2015-09-16 DIAGNOSIS — IMO0001 Reserved for inherently not codable concepts without codable children: Secondary | ICD-10-CM

## 2015-09-16 LAB — CBC
HCT: 31.6 % — ABNORMAL LOW (ref 36.0–46.0)
HEMOGLOBIN: 9.9 g/dL — AB (ref 12.0–15.0)
MCH: 23 pg — ABNORMAL LOW (ref 26.0–34.0)
MCHC: 31.3 g/dL (ref 30.0–36.0)
MCV: 73.5 fL — ABNORMAL LOW (ref 78.0–100.0)
Platelets: 185 10*3/uL (ref 150–400)
RBC: 4.3 MIL/uL (ref 3.87–5.11)
RDW: 13.7 % (ref 11.5–15.5)
WBC: 9.5 10*3/uL (ref 4.0–10.5)

## 2015-09-16 LAB — ABO/RH: ABO/RH(D): A POS

## 2015-09-16 LAB — RPR: RPR: NONREACTIVE

## 2015-09-16 LAB — TYPE AND SCREEN
ABO/RH(D): A POS
ANTIBODY SCREEN: NEGATIVE

## 2015-09-16 LAB — POCT FERN TEST: POCT FERN TEST: POSITIVE

## 2015-09-16 MED ORDER — LANOLIN HYDROUS EX OINT: TOPICAL_OINTMENT | CUTANEOUS | Status: DC | PRN

## 2015-09-16 MED ORDER — CITRIC ACID-SODIUM CITRATE 334-500 MG/5ML PO SOLN: 30.0000 mL | ORAL | Status: DC | PRN

## 2015-09-16 MED ORDER — DIPHENHYDRAMINE HCL 25 MG PO CAPS: 25.0000 mg | ORAL_CAPSULE | Freq: Four times a day (QID) | ORAL | Status: DC | PRN

## 2015-09-16 MED ORDER — LACTATED RINGERS IV SOLN
500.0000 mL | INTRAVENOUS | Status: DC | PRN
  Administered 2015-09-16: 500 mL via INTRAVENOUS

## 2015-09-16 MED ORDER — ZOLPIDEM TARTRATE 5 MG PO TABS: 5.0000 mg | ORAL_TABLET | Freq: Every evening | ORAL | Status: DC | PRN

## 2015-09-16 MED ORDER — TETANUS-DIPHTH-ACELL PERTUSSIS 5-2.5-18.5 LF-MCG/0.5 IM SUSP: 0.5000 mL | Freq: Once | INTRAMUSCULAR | Status: DC

## 2015-09-16 MED ORDER — SIMETHICONE 80 MG PO CHEW: 80.0000 mg | CHEWABLE_TABLET | ORAL | Status: DC | PRN

## 2015-09-16 MED ORDER — DIBUCAINE 1 % RE OINT: 1.0000 "application " | TOPICAL_OINTMENT | RECTAL | Status: DC | PRN

## 2015-09-16 MED ORDER — LIDOCAINE HCL (PF) 1 % IJ SOLN: 30.0000 mL | INTRAMUSCULAR | Status: DC | PRN

## 2015-09-16 MED ORDER — FENTANYL CITRATE (PF) 100 MCG/2ML IJ SOLN
100.0000 ug | INTRAMUSCULAR | Status: DC | PRN
  Administered 2015-09-16: 100 ug via INTRAVENOUS

## 2015-09-16 MED ORDER — FLEET ENEMA 7-19 GM/118ML RE ENEM: 1.0000 | ENEMA | RECTAL | Status: DC | PRN

## 2015-09-16 MED ORDER — EPHEDRINE 5 MG/ML INJ: 10.0000 mg | INTRAVENOUS | Status: DC | PRN

## 2015-09-16 MED ORDER — PRENATAL MULTIVITAMIN CH
1.0000 | ORAL_TABLET | Freq: Every day | ORAL | Status: DC
  Administered 2015-09-17: 1 via ORAL
  Filled 2015-09-16: qty 1

## 2015-09-16 MED ORDER — DIPHENHYDRAMINE HCL 50 MG/ML IJ SOLN: 12.5000 mg | INTRAMUSCULAR | Status: DC | PRN

## 2015-09-16 MED ORDER — PHENYLEPHRINE 40 MCG/ML (10ML) SYRINGE FOR IV PUSH (FOR BLOOD PRESSURE SUPPORT)
80.0000 ug | PREFILLED_SYRINGE | INTRAVENOUS | Status: DC | PRN
Start: 2015-09-16 — End: 2015-09-16
  Filled 2015-09-16: qty 20

## 2015-09-16 MED ORDER — PHENYLEPHRINE 40 MCG/ML (10ML) SYRINGE FOR IV PUSH (FOR BLOOD PRESSURE SUPPORT): 80.0000 ug | PREFILLED_SYRINGE | INTRAVENOUS | Status: DC | PRN

## 2015-09-16 MED ORDER — ONDANSETRON HCL 4 MG/2ML IJ SOLN
4.0000 mg | Freq: Four times a day (QID) | INTRAMUSCULAR | Status: DC | PRN
  Administered 2015-09-16: 4 mg via INTRAVENOUS
  Filled 2015-09-16: qty 2

## 2015-09-16 MED ORDER — OXYTOCIN 10 UNIT/ML IJ SOLN: 2.5000 [IU]/h | INTRAVENOUS | Status: DC

## 2015-09-16 MED ORDER — ONDANSETRON HCL 4 MG/2ML IJ SOLN: 4.0000 mg | INTRAMUSCULAR | Status: DC | PRN

## 2015-09-16 MED ORDER — OXYCODONE-ACETAMINOPHEN 5-325 MG PO TABS: 1.0000 | ORAL_TABLET | ORAL | Status: DC | PRN

## 2015-09-16 MED ORDER — FENTANYL 2.5 MCG/ML BUPIVACAINE 1/10 % EPIDURAL INFUSION (WH - ANES)
14.0000 mL/h | INTRAMUSCULAR | Status: DC | PRN
  Administered 2015-09-16: 14 mL/h via EPIDURAL
  Filled 2015-09-16: qty 125

## 2015-09-16 MED ORDER — FENTANYL CITRATE (PF) 100 MCG/2ML IJ SOLN
50.0000 ug | INTRAMUSCULAR | Status: DC | PRN
  Filled 2015-09-16: qty 2

## 2015-09-16 MED ORDER — TERBUTALINE SULFATE 1 MG/ML IJ SOLN
0.2500 mg | Freq: Once | INTRAMUSCULAR | Status: DC | PRN
Start: 2015-09-16 — End: 2015-09-16

## 2015-09-16 MED ORDER — ACETAMINOPHEN 325 MG PO TABS
650.0000 mg | ORAL_TABLET | ORAL | Status: DC | PRN
  Administered 2015-09-16: 650 mg via ORAL
  Filled 2015-09-16: qty 2

## 2015-09-16 MED ORDER — MISOPROSTOL 50MCG HALF TABLET
50.0000 ug | ORAL_TABLET | Freq: Once | ORAL | Status: AC
  Administered 2015-09-16: 50 ug via ORAL
  Filled 2015-09-16: qty 0.5

## 2015-09-16 MED ORDER — OXYCODONE-ACETAMINOPHEN 5-325 MG PO TABS: 2.0000 | ORAL_TABLET | ORAL | Status: DC | PRN

## 2015-09-16 MED ORDER — FERROUS SULFATE 325 (65 FE) MG PO TABS
325.0000 mg | ORAL_TABLET | Freq: Every day | ORAL | Status: DC
  Administered 2015-09-17: 325 mg via ORAL
  Filled 2015-09-16: qty 1

## 2015-09-16 MED ORDER — LACTATED RINGERS IV SOLN: 500.0000 mL | Freq: Once | INTRAVENOUS | Status: DC

## 2015-09-16 MED ORDER — WITCH HAZEL-GLYCERIN EX PADS
1.0000 "application " | MEDICATED_PAD | CUTANEOUS | Status: DC | PRN
  Administered 2015-09-16: 1 via TOPICAL

## 2015-09-16 MED ORDER — LACTATED RINGERS IV SOLN
INTRAVENOUS | Status: DC
  Administered 2015-09-16: 125 mL/h via INTRAVENOUS

## 2015-09-16 MED ORDER — BENZOCAINE-MENTHOL 20-0.5 % EX AERO
1.0000 "application " | INHALATION_SPRAY | CUTANEOUS | Status: DC | PRN
  Administered 2015-09-16: 1 via TOPICAL
  Filled 2015-09-16: qty 56

## 2015-09-16 MED ORDER — OXYTOCIN 10 UNIT/ML IJ SOLN
1.0000 m[IU]/min | INTRAVENOUS | Status: DC
  Administered 2015-09-16: 2 m[IU]/min via INTRAVENOUS
  Filled 2015-09-16: qty 4

## 2015-09-16 MED ORDER — SENNOSIDES-DOCUSATE SODIUM 8.6-50 MG PO TABS
2.0000 | ORAL_TABLET | ORAL | Status: DC
  Administered 2015-09-17: 2 via ORAL
  Filled 2015-09-16: qty 2

## 2015-09-16 MED ORDER — IBUPROFEN 600 MG PO TABS
600.0000 mg | ORAL_TABLET | Freq: Four times a day (QID) | ORAL | Status: DC
  Administered 2015-09-16 – 2015-09-17 (×5): 600 mg via ORAL
  Filled 2015-09-16 (×5): qty 1

## 2015-09-16 MED ORDER — ONDANSETRON HCL 4 MG PO TABS: 4.0000 mg | ORAL_TABLET | ORAL | Status: DC | PRN

## 2015-09-16 MED ORDER — LIDOCAINE HCL (PF) 1 % IJ SOLN
INTRAMUSCULAR | Status: DC | PRN
  Administered 2015-09-16 (×2): 7 mL via EPIDURAL

## 2015-09-16 MED ORDER — OXYTOCIN BOLUS FROM INFUSION: 500.0000 mL | INTRAVENOUS | Status: DC

## 2015-09-16 MED ORDER — ACETAMINOPHEN 325 MG PO TABS: 650.0000 mg | ORAL_TABLET | ORAL | Status: DC | PRN

## 2015-09-16 NOTE — Anesthesia Preprocedure Evaluation (Signed)
Anesthesia Evaluation  Patient identified by MRN, date of birth, ID band Patient awake    Reviewed: Allergy & Precautions, H&P , NPO status , Patient's Chart, lab work & pertinent test results  Airway Mallampati: II  TM Distance: >3 FB Neck ROM: full    Dental no notable dental hx.    Pulmonary neg pulmonary ROS,    Pulmonary exam normal       Cardiovascular negative cardio ROS Normal cardiovascular exam    Neuro/Psych negative neurological ROS     GI/Hepatic negative GI ROS, Neg liver ROS,   Endo/Other  negative endocrine ROS  Renal/GU negative Renal ROS     Musculoskeletal   Abdominal (+) + obese,   Peds  Hematology negative hematology ROS (+)   Anesthesia Other Findings   Reproductive/Obstetrics (+) Pregnancy                             Anesthesia Physical Anesthesia Plan  ASA: II  Anesthesia Plan: Epidural   Post-op Pain Management:    Induction:   Airway Management Planned:   Additional Equipment:   Intra-op Plan:   Post-operative Plan:   Informed Consent: I have reviewed the patients History and Physical, chart, labs and discussed the procedure including the risks, benefits and alternatives for the proposed anesthesia with the patient or authorized representative who has indicated his/her understanding and acceptance.     Plan Discussed with:   Anesthesia Plan Comments:         Anesthesia Quick Evaluation  

## 2015-09-16 NOTE — MAU Note (Signed)
PT ARRIVED  VIA  EMS  SAYING -     [facility] - 0245-  ON ARRIVAL- FLUID  IN  FLOOR-  SLIGHT  BROWNISH/  GREEN-  THIN  WATERY.     FEELING   SOME  [facility]        DENIES  HSV AND MRSA.      GBS- UNSURE.PNC-  HD-  VE  LAST WEEK    2-3  CM.

## 2015-09-16 NOTE — Anesthesia Procedure Notes (Signed)
Epidural Patient location during procedure: OB Start time: 09/16/2015 10:56 AM End time: 09/16/2015 11:00 AM  Staffing Anesthesiologist: Leilani Able Performed by: anesthesiologist   Preanesthetic Checklist Completed: patient identified, surgical consent, pre-op evaluation, timeout performed, IV checked, risks and benefits discussed and monitors and equipment checked  Epidural Patient position: sitting Prep: site prepped and draped and DuraPrep Patient monitoring: continuous pulse ox and blood pressure Approach: midline Location: L3-L4 Injection technique: LOR air  Needle:  Needle type: Tuohy  Needle gauge: 17 G Needle length: 9 cm and 9 Needle insertion depth: 5 cm cm Catheter type: closed end flexible Catheter size: 19 Gauge Catheter at skin depth: 10 cm Test dose: negative and Other  Assessment Sensory level: T10 Events: blood not aspirated, injection not painful, no injection resistance, negative IV test and no paresthesia  Additional Notes Reason for block:procedure for pain

## 2015-09-16 NOTE — Lactation Note (Signed)
This note was copied from a baby's chart. Lactation Consultation Note Initial visit at 5 hours of age.  Mom reports baby is doing well with breastfeeding and she denies any concerns.  Mom would like to breastfeed this baby longer than she did with her other children for 3 months each.  Encouraged mom to call with questions or concerns and attend support group if desired.  [facility] LC resources given and discussed.  Encouraged to feed with early cues on demand.  Early newborn behavior discussed.  Hand expression demonstrated by mom with colostrum visible.  Mom to call for assist as needed.    Patient Name: Caitlin James [id number] Date: 09/16/2015 Reason for consult: Initial assessment   Maternal Data Has patient been taught Hand Expression?: Yes Does the patient have breastfeeding experience prior to this delivery?: Yes  Feeding Feeding Type: Breast Fed Length of feed: 5 min  LATCH Score/Interventions Latch: Grasps breast easily, tongue down, lips flanged, rhythmical sucking.  Audible Swallowing: None Intervention(s): Skin to skin;Hand expression  Type of Nipple: Everted at rest and after stimulation  Comfort (Breast/Nipple): Soft / non-tender     Hold (Positioning): No assistance needed to correctly position infant at breast. Intervention(s): Breastfeeding basics reviewed  LATCH Score: 8  Lactation Tools Discussed/Used WIC Program: Yes   Consult Status Consult Status: Follow-up Date: 09/17/15 Follow-up type: In-patient    Jannifer Rodney 09/16/2015, 8:43 PM

## 2015-09-16 NOTE — Progress Notes (Signed)
Notified of pt arrival in MAU and exam with SROM of meconium stained fluid. Will put in orders for admission to labor and delivery

## 2015-09-16 NOTE — H&P (Signed)
Caitlin James is a 25 y.o. female G3P2 with IUP at [redacted]w[redacted]d presenting for SROM, at 0235, light mec fluid. Pt states she has been having irregulear contractions, associated with spotting vaginal bleeding.  Membranes are ruptured, with active fetal movement.   PNCare at HD  Prenatal History/Complications:  none  Past Medical History: Past Medical History  Diagnosis Date  . Depression     Past Surgical History: Past Surgical History  Procedure Laterality Date  . No past surgeries      Obstetrical History: OB History    Gravida Para Term Preterm AB TAB SAB Ectopic Multiple Living   Social History: Social History   Social History  . Marital Status: Single    Spouse Name: N/A  . Number of Children: N/A  . Years of Education: N/A   Social History Main Topics  . Smoking status: Never Smoker   . Smokeless tobacco: None  . Alcohol Use: No  . Drug Use: No  . Sexual Activity: No   Other Topics Concern  . None   Social History Narrative    Family History: History reviewed. No pertinent family history.  Allergies: No Known Allergies  Prescriptions prior to admission  Medication Sig Dispense Refill Last Dose  . acetaminophen (TYLENOL 8 HOUR) 650 MG CR tablet Take 1 tablet (650 mg total) by mouth every 8 (eight) hours as needed for pain. 30 tablet 0 Past Month at Unknown time  . cetirizine (ZYRTEC ALLERGY) 10 MG tablet Take 1 tablet (10 mg total) by mouth daily. 30 tablet 1 09/16/2015 at Unknown time  . cyclobenzaprine (FLEXERIL) 5 MG tablet Take 1 tablet (5 mg total) by mouth 3 (three) times daily as needed for muscle spasms. 30 tablet 1 Past Month at Unknown time  . ferrous sulfate 324 (65 Fe) MG TBEC TAKE ONE TABLET BY MOUTH TWO TIMES DAILY. (ROUND RED TABLET) 60 tablet 1 09/16/2015 at Unknown time  . prenatal vitamin w/FE, FA (PRENATAL 1 + 1) 27-1 MG TABS tablet Take 1 tablet by mouth daily. 30 each 12 09/16/2015 at Unknown time     Prenatal  Transfer Tool  Maternal Diabetes: No Genetic Screening: Normal Maternal Ultrasounds/Referrals: Normal Fetal Ultrasounds or other Referrals:  None Maternal Substance Abuse:  No Significant Maternal Medications:  None Significant Maternal Lab Results: None     Review of Systems   Constitutional: Negative for fever and chills Eyes: Negative for visual disturbances Respiratory: Negative for shortness of breath, dyspnea Cardiovascular: Negative for chest pain or palpitations  Gastrointestinal: Negative for vomiting, diarrhea and constipation.  POSITIVE for abdominal pain (contractions) Genitourinary: Negative for dysuria and urgency Musculoskeletal: Negative for back pain, joint pain, myalgias  Neurological: Negative for dizziness and headaches      Temperature 98.2 F (36.8 C), temperature source Oral, last menstrual period 12/03/2014, unknown if currently breastfeeding. General appearance: alert, cooperative and no distress Lungs: clear to auscultation bilaterally Heart: regular rate and rhythm Abdomen: soft, non-tender; bowel sounds normal Extremities: Homans sign is negative, no sign of DVT DTR's 2+ Presentation: cephalic Fetal monitoring  Baseline: \130 bpm, Variability: Good {> 6 bpm), Accelerations: Reactive and Decelerations: Absent Uterine activity  4-7  Dilation: 2.5 Effacement (%): 60 Station: -3 Exam by:: Sharen Hint RNC   Prenatal labs: ABO, Rh: --/--/A POS (02/24 0410) Antibody: NEG (02/24 0410) Rubella: !Error! RPR: Non Reactive (02/05 1631)  HBsAg: Negative (02/05 1631)  HIV:  Non Reactive (02/05 1631)  GBS: Negative (02/06 0000)  1 hr Glucola 77 Genetic screening  neg Anatomy US normal   Results for orders placed or performed during the hospital encounter of 09/16/15 (from the past 24 hour(s))  Fern Test   Collection Time: 09/16/15  3:50 AM  Result Value Ref Range   POCT Fern Test Positive = ruptured amniotic membanes   Type and screen  [facility] [place]   Collection Time: 09/16/15  4:10 AM  Result Value Ref Range   ABO/RH(D) A POS    Antibody Screen NEG    Sample Expiration 09/19/2015   CBC   Collection Time: 09/16/15  4:31 AM  Result Value Ref Range   WBC 9.5 4.0 - 10.5 K/uL   RBC 4.30 3.87 - 5.11 MIL/uL   Hemoglobin 9.9 (L) 12.0 - 15.0 g/dL   HCT 81.1 (L) 91.4 - 78.2 %   MCV 73.5 (L) 78.0 - 100.0 fL   MCH 23.0 (L) 26.0 - 34.0 pg   MCHC 31.3 30.0 - 36.0 g/dL   RDW 95.6 21.3 - 08.6 %   Platelets 185 150 - 400 K/uL    Assessment: Caitlin James is a 25 y.o. G3P2 with an IUP at [redacted]w[redacted]d presenting for SrOM/early labor  Plan: #Labor: expectant management, may need augmenting #Pain:  Per request #FWB Cat 1 #ID: GBS: neg  #MOF:  breast #MOC: unsure   CRESENZO-DISHMAN,Barrington Worley 09/16/2015, 6:38 AM

## 2015-09-16 NOTE — Consults (Signed)
  Anesthesia Pain Consult Note  Patient: Caitlin James, 24 y.o., female  Consult Requested by: Levie Heritage, DO  Reason for Consult: CRNA pain management rounds  Level of Consciousness: alert  Pain: 5 /10 stated pain goal: 5  Last Vitals:  Filed Vitals:   09/16/15 0443 09/16/15 0444  BP:  128/82  Pulse:  87  Temp: 36.8 C     Plan: Epidural infusion for pain control.    Leanore Biggers 09/16/2015

## 2015-09-17 MED ORDER — IBUPROFEN 600 MG PO TABS: 600.0000 mg | ORAL_TABLET | Freq: Four times a day (QID) | ORAL | Status: AC | PRN

## 2015-09-17 NOTE — Discharge Instructions (Signed)

## 2015-09-17 NOTE — Discharge Summary (Signed)
OB Discharge Summary     Patient Name: Caitlin James DOB: 04-Dec-1990 MRN: [id number]  Date of admission: 09/16/2015 Delivering MD: Cam Hai D   Date of discharge: 09/17/2015  Admitting diagnosis: 40 WEEKS CTX Intrauterine pregnancy: [redacted]w[redacted]d     Secondary diagnosis:  Active Problems:   Active labor  Additional problems: PROM     Discharge diagnosis: Term Pregnancy Delivered                                                                                                Post partum procedures:none  Augmentation: Pitocin and Cytotec  Complications: None  Hospital course:  Onset of Labor With Vaginal Delivery     25 y.o. yo G3P1001 at [redacted]w[redacted]d was admitted in Latent Labor/PROM on 09/16/2015. Patient had an uncomplicated labor course as follows:  Membrane Rupture Time/Date: 2:45 AM ,09/16/2015   Intrapartum Procedures: Episiotomy: None [1]                                         Lacerations:  None [1]  Patient had a delivery of a Viable infant. She had required a dose of PO cytotec followed by a small amount of Pitocin and then progressed to SVD. 09/16/2015  Information for the patient's newborn:  Tawsha, Terrero [[id number]]  Delivery Method: Vaginal, Spontaneous Delivery (Filed from Delivery Summary)    Pateint had an uncomplicated postpartum course.  She is ambulating, tolerating a regular diet, passing flatus, and urinating well. Patient is discharged home in stable condition on 09/17/2015.    Physical exam  Filed Vitals:   09/16/15 1751 09/16/15 1855 09/16/15 2206 09/17/15 0500  BP: 121/69 114/60 123/69 122/72  Pulse: 86 92 79 84  Temp: 98.5 F (36.9 C) 98.1 F (36.7 C) 98.3 F (36.8 C) 98.1 F (36.7 C)  TempSrc:  Oral Oral Oral  Resp: Height:      Weight:      SpO2:       General: alert and cooperative Lochia: appropriate Uterine Fundus: firm Incision: N/A DVT Evaluation: No evidence of DVT seen on physical exam. Labs: Lab Results   Component Value Date   WBC 9.5 09/16/2015   HGB 9.9* 09/16/2015   HCT 31.6* 09/16/2015   MCV 73.5* 09/16/2015   PLT 185 09/16/2015   CMP Latest Ref Rng 08/28/2015  Glucose 65 - 99 mg/dL 87  BUN 6 - 20 mg/dL 6  Creatinine 3.47 - 4.25 mg/dL 9.56  Sodium 387 - 564 mmol/L 137  Potassium 3.5 - 5.1 mmol/L 4.2  Chloride 101 - 111 mmol/L 104  CO2 22 - 32 mmol/L 24  Calcium 8.9 - 10.3 mg/dL 9.6  Total Protein 6.5 - 8.1 g/dL 7.4  Total Bilirubin 0.3 - 1.2 mg/dL 3.3(I)  Alkaline Phos 38 - 126 U/L 73  AST 15 - 41 U/L 18  ALT 14 - 54 U/L 14    Discharge instruction: per After Visit Summary and "Baby and Me Booklet".  After visit meds:    Medication List    STOP taking these medications        acetaminophen 650 MG CR tablet  Commonly known as:  TYLENOL 8 HOUR     cetirizine 10 MG tablet  Commonly known as:  ZYRTEC ALLERGY     cyclobenzaprine 5 MG tablet  Commonly known as:  FLEXERIL      TAKE these medications        ferrous sulfate 324 (65 Fe) MG Tbec  TAKE ONE TABLET BY MOUTH TWO TIMES DAILY. (ROUND RED TABLET)     ibuprofen 600 MG tablet  Commonly known as:  ADVIL,MOTRIN  Take 1 tablet (600 mg total) by mouth every 6 (six) hours as needed.     prenatal vitamin w/FE, FA 27-1 MG Tabs tablet  Take 1 tablet by mouth daily.        Diet: routine diet  Activity: Advance as tolerated. Pelvic rest for 6 weeks.   Outpatient follow up:6 weeks Follow up Appt:No future appointments. Follow up Visit:No Follow-up on file.  Postpartum contraception: Nexplanon  Newborn Data: Live born female  Birth Weight: 8 lb 6.2 oz (3805 g) APGAR: 9, 9  Baby Feeding: Breast Disposition:home with mother   09/17/2015 Cam Hai, CNM  7:27 AM

## 2015-09-17 NOTE — Anesthesia Postprocedure Evaluation (Signed)
Anesthesia Post Note  Patient: Caitlin James  Procedure(s) Performed: * No procedures listed *  Patient location during evaluation: Mother Baby Anesthesia Type: Epidural Level of consciousness: awake and alert and oriented Pain management: satisfactory to patient Vital Signs Assessment: post-procedure vital signs reviewed and stable Respiratory status: spontaneous breathing and nonlabored ventilation Cardiovascular status: stable Postop Assessment: no headache, no backache, no signs of nausea or vomiting, adequate PO intake and patient able to bend at knees (patient up walking) Anesthetic complications: no    Last Vitals:  Filed Vitals:   09/16/15 2206 09/17/15 0500  BP: 123/69 122/72  Pulse: 79 84  Temp: 36.8 C 36.7 C  Resp: 16 18    Last Pain:  Filed Vitals:   09/17/15 0632  PainSc: 3                  [name]

## 2015-09-18 ENCOUNTER — Ambulatory Visit: Payer: Self-pay

## 2015-09-18 NOTE — Lactation Note (Signed)
This note was copied from a baby's chart. Lactation Consultation Note  Follow up visit prior to discharge.  Mom states baby is having difficulty latching to left breast.. Assisted with latch attempt but baby unable to sustain latch.  20 mm nipple shield applied but baby too sleepy.  Breasts are filling.  Reviewed supply and demand and discouraged formula.  Lactation outpatient services and support encouraged.  Patient Name: Caitlin James [id number] Date: 09/18/2015     Maternal Data    Feeding Length of feed: 5 min  LATCH Score/Interventions                      Lactation Tools Discussed/Used     Consult Status      Huston Foley 09/18/2015, 11:38 AM
